# Patient Record
Sex: Female | Born: 1960 | Race: Black or African American | Hispanic: No | Marital: Single | Smoking: Never smoker
Health system: Southern US, Community
[De-identification: ages and names within clinical notes are randomized; demographics above are authoritative.]

## PROBLEM LIST (undated history)

## (undated) DIAGNOSIS — I10 Essential (primary) hypertension: Secondary | ICD-10-CM

## (undated) HISTORY — DX: Essential (primary) hypertension: I10

## (undated) HISTORY — PX: ABDOMINAL HYSTERECTOMY: SHX81

---

## 2009-05-15 ENCOUNTER — Ambulatory Visit: Payer: Self-pay | Admitting: Internal Medicine

## 2010-09-19 ENCOUNTER — Ambulatory Visit: Payer: Self-pay

## 2011-02-25 ENCOUNTER — Ambulatory Visit: Payer: Self-pay | Admitting: Gastroenterology

## 2011-02-27 LAB — PATHOLOGY REPORT

## 2011-06-05 ENCOUNTER — Ambulatory Visit: Payer: Self-pay

## 2011-09-20 ENCOUNTER — Ambulatory Visit: Payer: Self-pay

## 2012-09-21 ENCOUNTER — Ambulatory Visit: Payer: Self-pay

## 2013-12-07 ENCOUNTER — Ambulatory Visit: Payer: Self-pay

## 2015-12-08 ENCOUNTER — Other Ambulatory Visit: Payer: Self-pay | Admitting: Nurse Practitioner

## 2015-12-08 DIAGNOSIS — Z1231 Encounter for screening mammogram for malignant neoplasm of breast: Secondary | ICD-10-CM

## 2015-12-09 IMAGING — MG MM DIGITAL SCREENING BILAT W/ CAD
5 series · 5 of 5 positions shown · non-contrast
Comparison: Previous exam(s).

CLINICAL DATA: Screening.

EXAM:
DIGITAL SCREENING BILATERAL MAMMOGRAM WITH CAD

[L MLO]
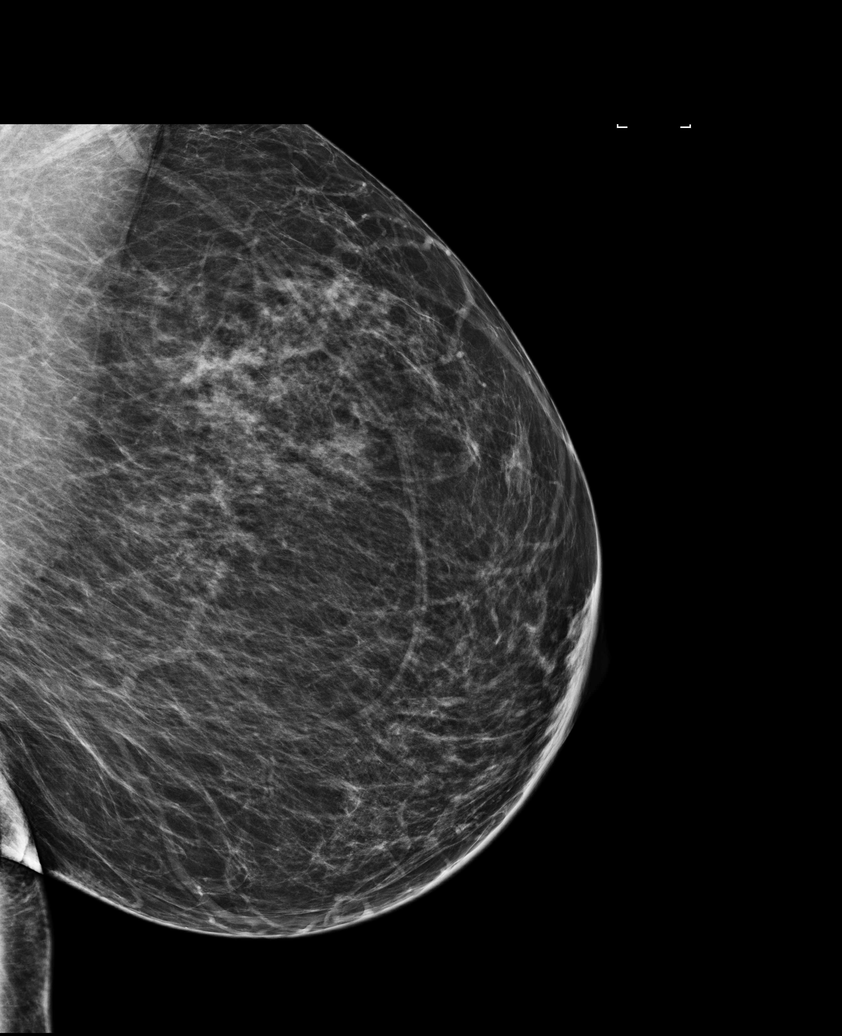

[R MLO (1 of 2)]
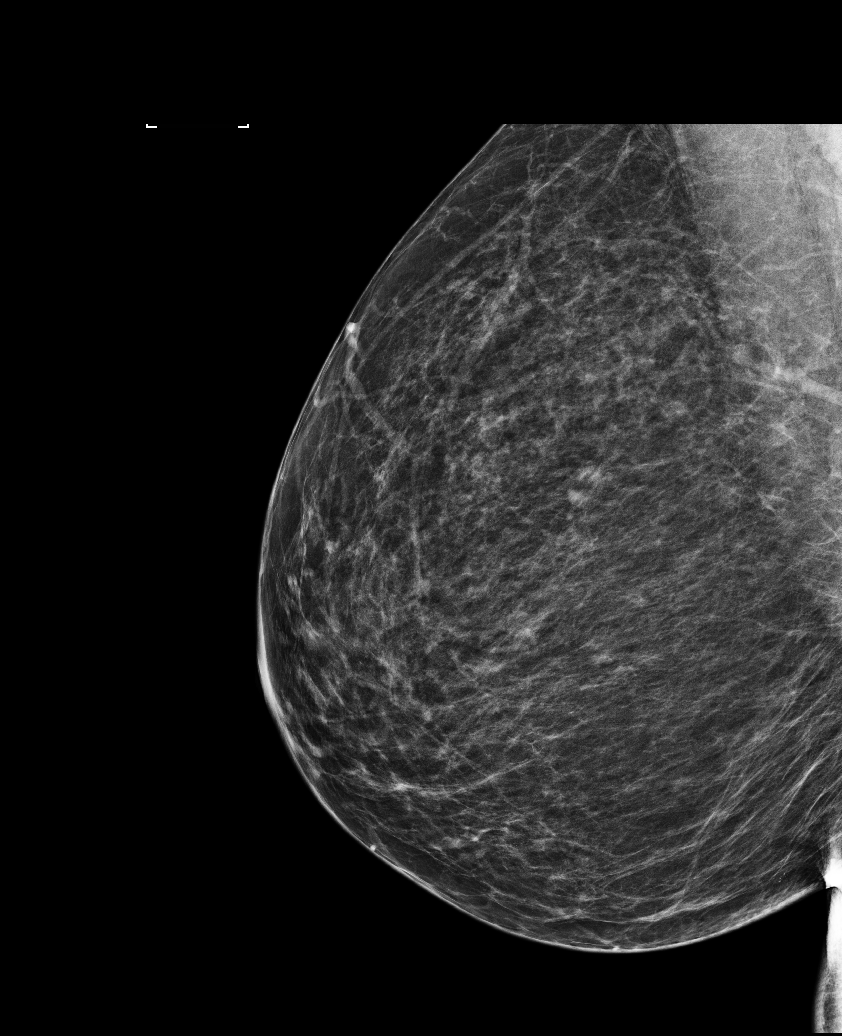

[L CC]
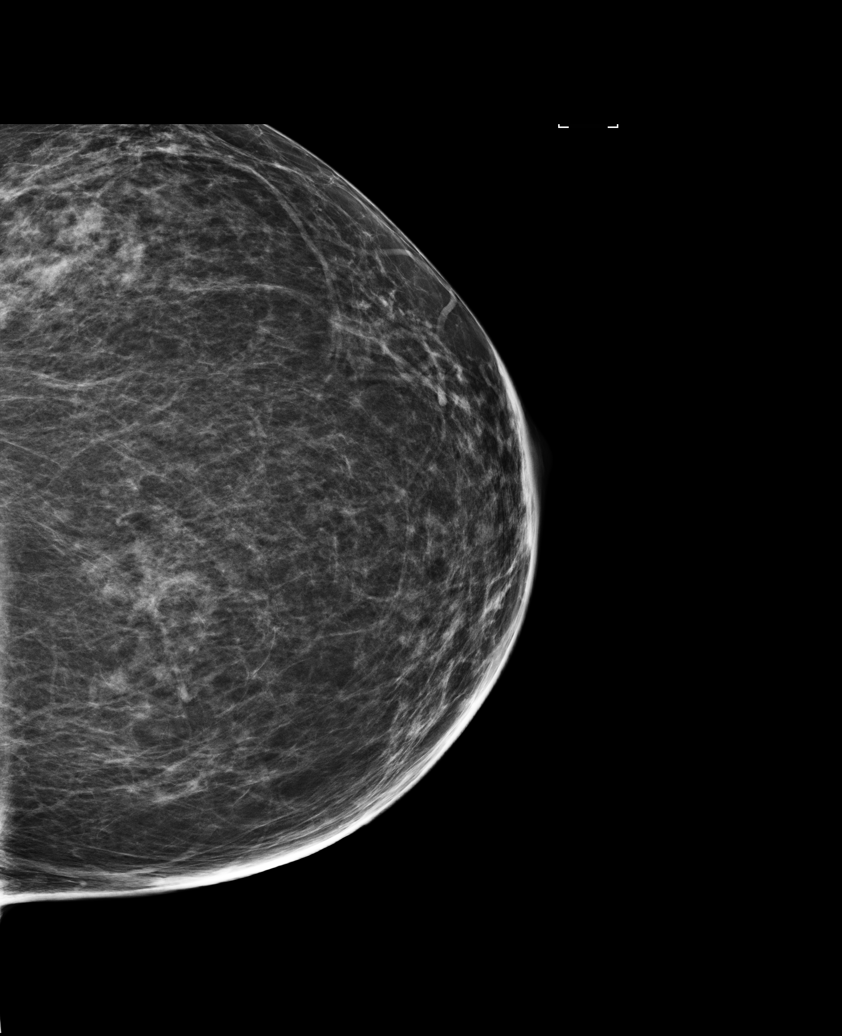

[R MLO (2 of 2)]
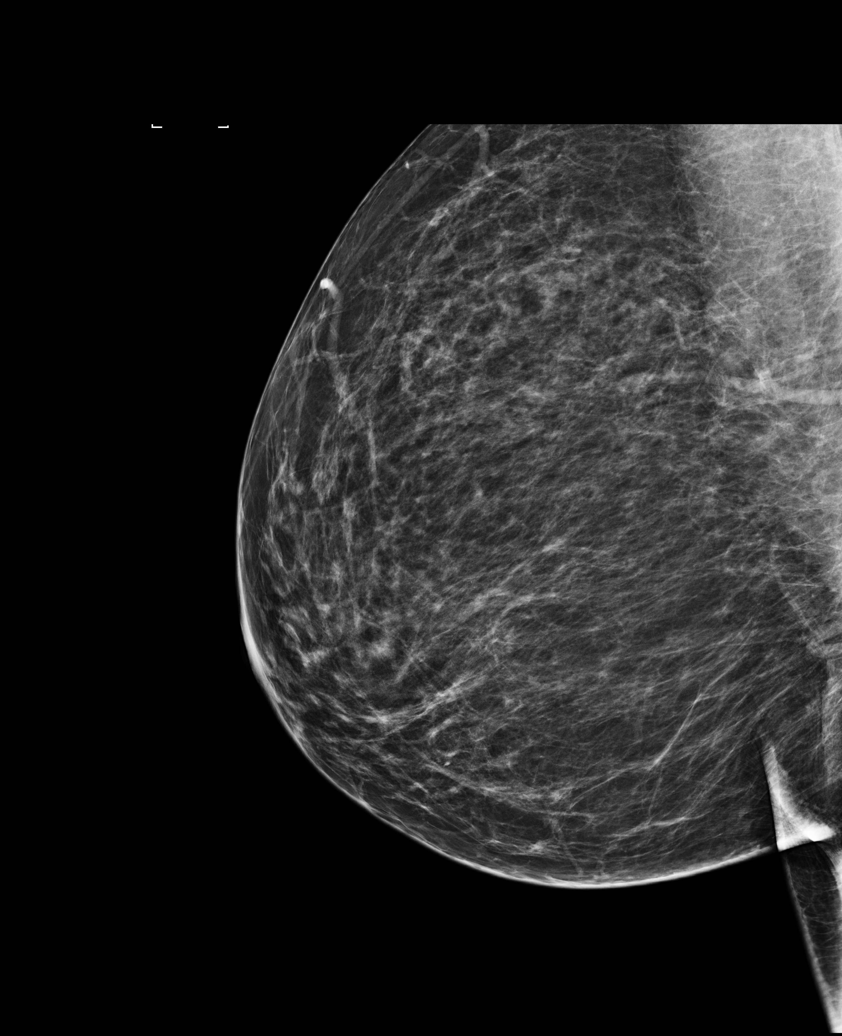

[R CC]
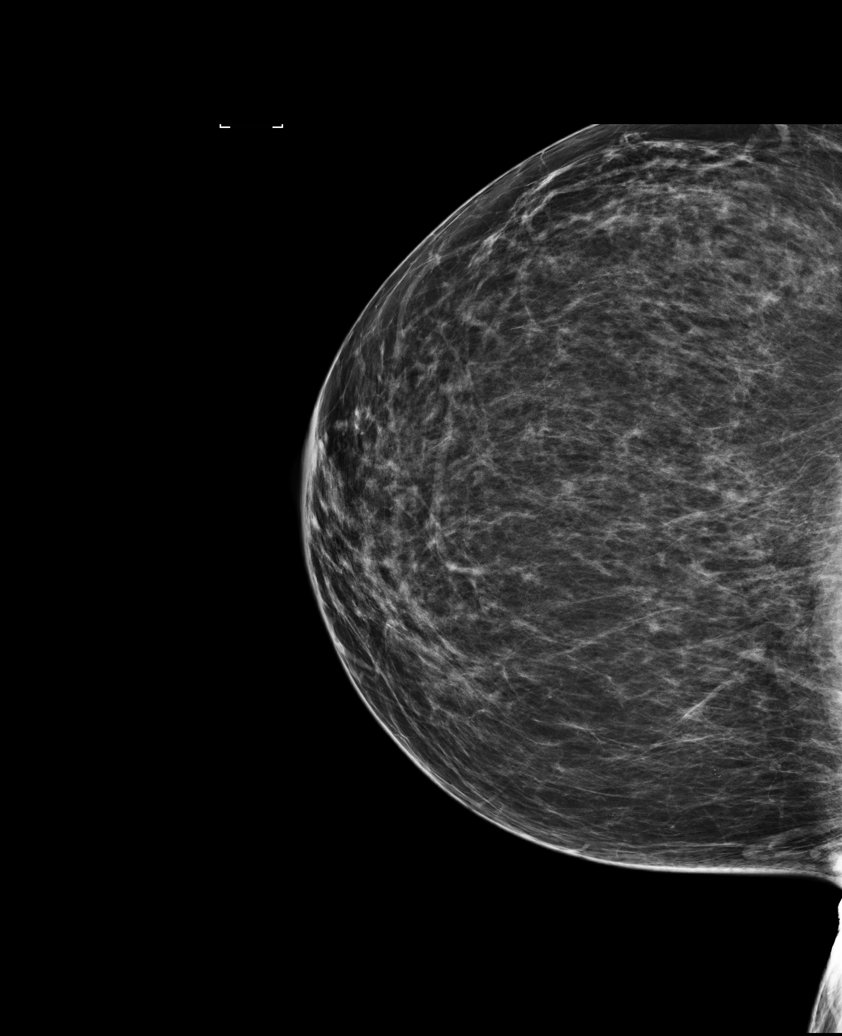

[5 of 5 positions shown; findings below may reference images not displayed]

ACR Breast Density Category b: There are scattered areas of
fibroglandular density.
FINDINGS: There are no findings suspicious for malignancy. Images were
processed with CAD.
IMPRESSION: No mammographic evidence of malignancy. A result letter of this
screening mammogram will be mailed directly to the patient.

RECOMMENDATION:
Screening mammogram in one year. (Code:AS-G-LCT)

BI-RADS CATEGORY  1: Negative.

## 2016-04-15 ENCOUNTER — Ambulatory Visit: Payer: Self-pay | Attending: Nurse Practitioner

## 2017-01-30 ENCOUNTER — Encounter: Payer: Self-pay | Admitting: Nurse Practitioner

## 2017-01-30 ENCOUNTER — Ambulatory Visit: Payer: BLUE CROSS/BLUE SHIELD | Admitting: Nurse Practitioner

## 2017-01-30 VITALS — BP 120/70 | HR 90 | Resp 16 | Ht 68.0 in | Wt 156.0 lb

## 2017-01-30 DIAGNOSIS — E559 Vitamin D deficiency, unspecified: Secondary | ICD-10-CM | POA: Diagnosis not present

## 2017-01-30 DIAGNOSIS — Z1231 Encounter for screening mammogram for malignant neoplasm of breast: Secondary | ICD-10-CM

## 2017-01-30 DIAGNOSIS — I10 Essential (primary) hypertension: Secondary | ICD-10-CM | POA: Insufficient documentation

## 2017-01-30 DIAGNOSIS — Z1239 Encounter for other screening for malignant neoplasm of breast: Secondary | ICD-10-CM

## 2017-01-30 NOTE — Progress Notes (Addendum)
[facility] [address] [place], [place] [address]  Internal MEDICINE  Office Visit Note  Patient Name: Donna Donaldson  [id number]  [id number]  Date of Service: 01/30/2017  Chief Complaint  Patient presents with  . Hypertension    Since her most recent visit, the patient has made some significant diet and lifestyle changes. She has stopped eating breads and salty foods. She has also cut back on soft drinks and sugary drinks. She has lost close to 10 pounds since she was last seen. She feels good .   Hypertension  This is a chronic problem. The current episode started more than 1 year ago. The problem has been gradually improving since onset. The problem is controlled. Pertinent negatives include no chest pain, headaches, neck pain, palpitations or shortness of breath. There are no associated agents to hypertension. There are no known risk factors for coronary artery disease. Past treatments include ACE inhibitors and diuretics. The current treatment provides significant improvement. There are no compliance problems.     Pt is here for routine follow up.    Current Medication: Outpatient Encounter Medications as of 01/30/2017  Medication Sig  . hydrochlorothiazide (HYDRODIURIL) 25 MG tablet Take by mouth.  Marland Kitchen lisinopril (PRINIVIL,ZESTRIL) 5 MG tablet Take 5 mg by mouth daily.   No facility-administered encounter medications on file as of 01/30/2017.     Surgical History: Past Surgical History:  Procedure Laterality Date  . ABDOMINAL HYSTERECTOMY      Medical History: Past Medical History:  Diagnosis Date  . Hypertension     Family History: Family History  Problem Relation Age of Onset  . Stroke Mother   . Hypertension Mother   . Diabetes Mother   . Diabetes Maternal Aunt   . Lung cancer Maternal Aunt     Social History   Socioeconomic History  . Marital status: Single    Spouse name: Not on file  . Number of children: Not on file  . Years of  education: Not on file  . Highest education level: Not on file  Social Needs  . Financial resource strain: Not on file  . Food insecurity - worry: Not on file  . Food insecurity - inability: Not on file  . Transportation needs - medical: Not on file  . Transportation needs - non-medical: Not on file  Occupational History  . Not on file  Tobacco Use  . Smoking status: Never Smoker  . Smokeless tobacco: Never Used  Substance and Sexual Activity  . Alcohol use: Yes    Frequency: Never    Comment: social  . Drug use: No  . Sexual activity: Not on file  Other Topics Concern  . Not on file  Social History Narrative  . Not on file      Review of Systems  Constitutional: Negative for activity change, appetite change, chills, fatigue and unexpected weight change.  HENT: Negative for congestion, postnasal drip, rhinorrhea, sneezing and sore throat.   Eyes: Negative.  Negative for redness.  Respiratory: Negative for cough, chest tightness and shortness of breath.   Cardiovascular: Negative for chest pain and palpitations.       Blood pressures doing very well.   Gastrointestinal: Negative for abdominal pain, constipation, diarrhea, nausea and vomiting.  Endocrine: Negative for cold intolerance and heat intolerance.  Genitourinary: Negative for dysuria and frequency.  Musculoskeletal: Negative for arthralgias, back pain, joint swelling and neck pain.  Skin: Negative for rash.  Neurological: Negative.  Negative for tremors, numbness  and headaches.  Hematological: Negative.  Negative for adenopathy. Does not bruise/bleed easily.  Psychiatric/Behavioral: Negative for behavioral problems (Depression), sleep disturbance and suicidal ideas. The patient is not nervous/anxious.     Today's Vitals   01/30/17 0940  BP: 120/70  Pulse: 90  Resp: 16  SpO2: 97%  Weight: 156 lb (70.8 kg)  Height: 5\' 8"  (1.727 m)    Physical Exam  Constitutional: She is oriented to person, place, and time.  She appears well-developed and well-nourished.  HENT:  Head: Normocephalic.  Eyes: Pupils are equal, round, and reactive to light.  Neck: Normal range of motion. Neck supple. No JVD present. Carotid bruit is not present. No thyromegaly present.  Cardiovascular: Normal rate, regular rhythm and normal heart sounds.  Pulmonary/Chest: Effort normal and breath sounds normal. She has no wheezes. She exhibits no tenderness.  Abdominal: Soft. Bowel sounds are normal. There is no tenderness.  Musculoskeletal: Normal range of motion.  Neurological: She is alert and oriented to person, place, and time.  Skin: Skin is warm and dry.  Psychiatric: She has a normal mood and affect. Her behavior is normal. Judgment and thought content normal.  Nursing note and vitals reviewed.   Assessment/Plan:  1. Essential hypertension bp doing very well. Continue lisinopril and HCTZ as prescribed. Will consider weaning off lisinopril of BP remains so well controlled.   2. Vitamin D deficiency Recent labs show normal vitamin d. Continue to monitor.   3. Screening for breast cancer - MM DIGITAL SCREENING BILATERAL; Future  General Counseling: Donna Donaldson verbalizes understanding of the findings of todays visit and agrees with plan of treatment. I have discussed any further diagnostic evaluation that may be needed or ordered today. We also reviewed her medications today. she has been encouraged to call the office with any questions or concerns that should arise related to todays visit.   This patient was seen by Vincent GrosHeather Evander Macaraeg, FNP- C in Collaboration with Dr Lyndon CodeFozia M Khan as a part of collaborative care agreement      Time spent: 15 Minutes      Dr Lyndon CodeFozia M Khan Internal medicine

## 2017-02-27 ENCOUNTER — Ambulatory Visit
Admission: RE | Admit: 2017-02-27 | Discharge: 2017-02-27 | Disposition: A | Discharge status: Home / Self Care | Payer: BLUE CROSS/BLUE SHIELD | Source: Ambulatory Visit | Attending: Nurse Practitioner | Admitting: Nurse Practitioner

## 2017-02-27 DIAGNOSIS — Z1231 Encounter for screening mammogram for malignant neoplasm of breast: Secondary | ICD-10-CM | POA: Diagnosis not present

## 2017-02-27 DIAGNOSIS — Z1239 Encounter for other screening for malignant neoplasm of breast: Secondary | ICD-10-CM

## 2017-03-01 ENCOUNTER — Other Ambulatory Visit: Payer: Self-pay | Admitting: Internal Medicine

## 2017-03-03 ENCOUNTER — Other Ambulatory Visit: Payer: Self-pay

## 2017-07-07 ENCOUNTER — Telehealth: Payer: Self-pay

## 2017-07-07 NOTE — Telephone Encounter (Signed)
Pt called that her Bp is high 159/100 and she took her med this morning and she check again 162/108 no chest pain or headache but her left leg feels funny may be tingling she not specify but her bp is not  Going down as per heather advised pt  to  ER and keep follow up with us

## 2017-07-31 ENCOUNTER — Ambulatory Visit: Payer: BLUE CROSS/BLUE SHIELD | Admitting: Nurse Practitioner

## 2017-07-31 ENCOUNTER — Encounter: Payer: Self-pay | Admitting: Nurse Practitioner

## 2017-07-31 VITALS — BP 132/90 | HR 74 | Resp 16 | Ht 68.0 in | Wt 160.2 lb

## 2017-07-31 DIAGNOSIS — Z0001 Encounter for general adult medical examination with abnormal findings: Secondary | ICD-10-CM | POA: Diagnosis not present

## 2017-07-31 DIAGNOSIS — R300 Dysuria: Secondary | ICD-10-CM

## 2017-07-31 DIAGNOSIS — I10 Essential (primary) hypertension: Secondary | ICD-10-CM | POA: Diagnosis not present

## 2017-07-31 NOTE — Progress Notes (Signed)
[facility] [address] [place], [place] [address]  Internal MEDICINE  Office Visit Note  Patient Name: Donna Donaldson  [id number]  [id number]  Date of Service: 08/17/2017   Pt is here for routine health maintenance examination   Chief Complaint  Patient presents with  . Annual Exam  . Hypertension    6 month follow up     Hypertension  This is a chronic problem. The current episode started more than 1 year ago. The problem is unchanged. The problem is controlled. Pertinent negatives include no chest pain, headaches, neck pain, palpitations or shortness of breath. There are no associated agents to hypertension. Risk factors for coronary artery disease include family history and post-menopausal state. Past treatments include ACE inhibitors and diuretics. The current treatment provides moderate improvement. There are no compliance problems.     Current Medication: Outpatient Encounter Medications as of 07/31/2017  Medication Sig Note  . hydrochlorothiazide (HYDRODIURIL) 25 MG tablet Take by mouth.   Marland Kitchen lisinopril (PRINIVIL,ZESTRIL) 5 MG tablet take 1 tablet by mouth once daily 07/31/2017: Pt is taking 25mg  dose   No facility-administered encounter medications on file as of 07/31/2017.     Surgical History: Past Surgical History:  Procedure Laterality Date  . ABDOMINAL HYSTERECTOMY      Medical History: Past Medical History:  Diagnosis Date  . Hypertension     Family History: Family History  Problem Relation Age of Onset  . Stroke Mother   . Hypertension Mother   . Diabetes Mother   . Diabetes Maternal Aunt   . Lung cancer Maternal Aunt   . Breast cancer Neg Hx       Review of Systems  Constitutional: Negative for activity change, appetite change, chills, fatigue and unexpected weight change.  HENT: Negative for congestion, postnasal drip, rhinorrhea, sneezing, sore throat and voice change.   Eyes: Negative.  Negative for redness.  Respiratory:  Negative for cough, chest tightness and shortness of breath.   Cardiovascular: Negative for chest pain and palpitations.       Blood pressures doing very well.   Gastrointestinal: Negative for abdominal pain, constipation, diarrhea, nausea and vomiting.  Endocrine: Negative for cold intolerance, heat intolerance, polydipsia, polyphagia and polyuria.  Genitourinary: Negative.  Negative for dysuria and frequency.  Musculoskeletal: Negative for arthralgias, back pain, joint swelling and neck pain.  Skin: Negative for rash.  Allergic/Immunologic: Negative for environmental allergies and food allergies.  Neurological: Negative for dizziness, tremors, weakness, light-headedness, numbness and headaches.  Hematological: Negative for adenopathy. Does not bruise/bleed easily.  Psychiatric/Behavioral: Negative for behavioral problems (Depression), sleep disturbance and suicidal ideas. The patient is not nervous/anxious.      Today's Vitals   07/31/17 1003  BP: 132/90  Pulse: 74  Resp: 16  SpO2: 98%  Weight: 160 lb 3.2 oz (72.7 kg)  Height: 5\' 8"  (1.727 m)    Physical Exam  Constitutional: She is oriented to person, place, and time. She appears well-developed and well-nourished.  HENT:  Head: Normocephalic.  Nose: Nose normal.  Mouth/Throat: Oropharynx is clear and moist.  Eyes: Pupils are equal, round, and reactive to light. Conjunctivae and EOM are normal.  Neck: Normal range of motion. Neck supple. No JVD present. Carotid bruit is not present. No tracheal deviation present. No thyromegaly present.  Cardiovascular: Normal rate, regular rhythm, normal heart sounds and intact distal pulses.  Pulmonary/Chest: Effort normal and breath sounds normal. She has no wheezes. She exhibits no tenderness. Right breast exhibits no inverted nipple,  no mass, no nipple discharge, no skin change and no tenderness. Left breast exhibits no inverted nipple, no mass, no nipple discharge, no skin change and no  tenderness.  Abdominal: Soft. Bowel sounds are normal. There is no tenderness.  Musculoskeletal: Normal range of motion.  Lymphadenopathy:    She has no cervical adenopathy.  Neurological: She is alert and oriented to person, place, and time.  Skin: Skin is warm and dry. Capillary refill takes less than 2 seconds.  Psychiatric: She has a normal mood and affect. Her behavior is normal. Judgment and thought content normal.  Nursing note and vitals reviewed.    LABS: Recent Results (from the past 2160 hour(s))  UA/M w/rflx Culture, Routine     Status: None   Collection Time: 07/31/17 12:00 AM  Result Value Ref Range   Specific Gravity, UA 1.017 1.005 - 1.030   pH, UA 6.5 5.0 - 7.5   Color, UA Yellow Yellow   Appearance Ur Clear Clear   Leukocytes, UA Negative Negative   Protein, UA Negative Negative/Trace   Glucose, UA Negative Negative   Ketones, UA Negative Negative   RBC, UA Negative Negative   Bilirubin, UA Negative Negative   Urobilinogen, Ur 0.2 0.2 - 1.0 mg/dL   Nitrite, UA Negative Negative   Microscopic Examination Comment     Comment: Microscopic follows if indicated.   Microscopic Examination See below:     Comment: Microscopic was indicated and was performed.   Urinalysis Reflex Comment     Comment: This specimen will not reflex to a Urine Culture.  Microscopic Examination     Status: None   Collection Time: 07/31/17 12:00 AM  Result Value Ref Range   WBC, UA 0-5 0 - 5 /hpf   RBC, UA 0-2 0 - 2 /hpf   Epithelial Cells (non renal) None seen 0 - 10 /hpf   Casts None seen None seen /lpf   Mucus, UA Present Not Estab.   Bacteria, UA None seen None seen/Few    Assessment/Plan: 1. Encounter for general adult medical examination with abnormal findings Annual health maintenance exam today  2. Essential hypertension Stable. Continue bp medication as prescribed   3. Dysuria - UA/M w/rflx Culture, Routine  General Counseling: Donna Donaldson verbalizes understanding of the  findings of todays visit and agrees with plan of treatment. I have discussed any further diagnostic evaluation that may be needed or ordered today. We also reviewed her medications today. she has been encouraged to call the office with any questions or concerns that should arise related to todays visit.    Counseling:  Hypertension Counseling:   The following hypertensive lifestyle modification were recommended and discussed:  1. Limiting alcohol intake to less than 1 oz/day of ethanol:(24 oz of beer or 8 oz of wine or 2 oz of 100-proof whiskey). 2. Take baby ASA 81 mg daily. 3. Importance of regular aerobic exercise and losing weight. 4. Reduce dietary saturated fat and cholesterol intake for overall cardiovascular health. 5. Maintaining adequate dietary potassium, calcium, and magnesium intake. 6. Regular monitoring of the blood pressure. 7. Reduce sodium intake to less than 100 mmol/day (less than 2.3 gm of sodium or less than 6 gm of sodium choride)   This patient was seen by Vincent GrosHeather Rekita Miotke FNP Collaboration with Dr Lyndon CodeFozia M Khan as a part of collaborative care agreement  Orders Placed This Encounter  Procedures  . Microscopic Examination  . UA/M w/rflx Culture, Routine     Time spent: 30 Minutes  Lavera Guise, MD  Internal Medicine

## 2017-08-01 LAB — UA/M W/RFLX CULTURE, ROUTINE
BILIRUBIN UA: NEGATIVE
Glucose, UA: NEGATIVE
KETONES UA: NEGATIVE
LEUKOCYTES UA: NEGATIVE
Nitrite, UA: NEGATIVE
Protein, UA: NEGATIVE
RBC, UA: NEGATIVE
Specific Gravity, UA: 1.017 (ref 1.005–1.030)
Urobilinogen, Ur: 0.2 mg/dL (ref 0.2–1.0)
pH, UA: 6.5 (ref 5.0–7.5)

## 2017-08-01 LAB — MICROSCOPIC EXAMINATION
BACTERIA UA: NONE SEEN
CASTS: NONE SEEN /LPF
EPITHELIAL CELLS (NON RENAL): NONE SEEN /HPF (ref 0–10)

## 2017-08-17 DIAGNOSIS — R300 Dysuria: Secondary | ICD-10-CM | POA: Insufficient documentation

## 2017-08-17 DIAGNOSIS — Z0001 Encounter for general adult medical examination with abnormal findings: Secondary | ICD-10-CM | POA: Insufficient documentation

## 2017-11-21 ENCOUNTER — Other Ambulatory Visit: Payer: Self-pay | Admitting: Nurse Practitioner

## 2017-11-22 LAB — COMPREHENSIVE METABOLIC PANEL
A/G RATIO: 1.8 (ref 1.2–2.2)
ALBUMIN: 4.4 g/dL (ref 3.5–5.5)
ALT: 23 IU/L (ref 0–32)
AST: 21 IU/L (ref 0–40)
Alkaline Phosphatase: 62 IU/L (ref 39–117)
BILIRUBIN TOTAL: 0.4 mg/dL (ref 0.0–1.2)
BUN / CREAT RATIO: 15 (ref 9–23)
BUN: 13 mg/dL (ref 6–24)
CHLORIDE: 102 mmol/L (ref 96–106)
CO2: 26 mmol/L (ref 20–29)
Calcium: 10 mg/dL (ref 8.7–10.2)
Creatinine, Ser: 0.84 mg/dL (ref 0.57–1.00)
GFR calc Af Amer: 88 mL/min/{1.73_m2} (ref >59)
GFR calc non Af Amer: 76 mL/min/{1.73_m2} (ref >59)
Globulin, Total: 2.5 g/dL (ref 1.5–4.5)
Glucose: 95 mg/dL (ref 65–99)
POTASSIUM: 4.7 mmol/L (ref 3.5–5.2)
Sodium: 141 mmol/L (ref 134–144)
Total Protein: 6.9 g/dL (ref 6.0–8.5)

## 2017-11-22 LAB — VITAMIN D 25 HYDROXY (VIT D DEFICIENCY, FRACTURES): Vit D, 25-Hydroxy: 52.5 ng/mL (ref 30.0–100.0)

## 2017-11-22 LAB — CBC
Hematocrit: 39 % (ref 34.0–46.6)
Hemoglobin: 12.9 g/dL (ref 11.1–15.9)
MCH: 30.6 pg (ref 26.6–33.0)
MCHC: 33.1 g/dL (ref 31.5–35.7)
MCV: 92 fL (ref 79–97)
PLATELETS: 234 10*3/uL (ref 150–450)
RBC: 4.22 x10E6/uL (ref 3.77–5.28)
RDW: 11.9 % — AB (ref 12.3–15.4)
WBC: 2.8 10*3/uL — ABNORMAL LOW (ref 3.4–10.8)

## 2017-11-22 LAB — LIPID PANEL W/O CHOL/HDL RATIO
CHOLESTEROL TOTAL: 182 mg/dL (ref 100–199)
HDL: 66 mg/dL (ref >39)
LDL Calculated: 106 mg/dL — ABNORMAL HIGH (ref 0–99)
TRIGLYCERIDES: 50 mg/dL (ref 0–149)
VLDL Cholesterol Cal: 10 mg/dL (ref 5–40)

## 2017-11-22 LAB — TSH: TSH: 2.57 u[IU]/mL (ref 0.450–4.500)

## 2017-11-22 LAB — T4, FREE: FREE T4: 1.04 ng/dL (ref 0.82–1.77)

## 2018-01-29 ENCOUNTER — Other Ambulatory Visit: Payer: Self-pay | Admitting: Nurse Practitioner

## 2018-01-29 DIAGNOSIS — Z1231 Encounter for screening mammogram for malignant neoplasm of breast: Secondary | ICD-10-CM

## 2018-02-02 ENCOUNTER — Ambulatory Visit: Payer: BLUE CROSS/BLUE SHIELD | Admitting: Adult Health

## 2018-02-13 ENCOUNTER — Encounter: Payer: Self-pay | Admitting: Adult Health

## 2018-02-13 ENCOUNTER — Ambulatory Visit: Payer: BLUE CROSS/BLUE SHIELD | Admitting: Adult Health

## 2018-02-13 ENCOUNTER — Ambulatory Visit: Admit: 2018-02-13 | Payer: BLUE CROSS/BLUE SHIELD

## 2018-02-13 VITALS — BP 130/90 | HR 66 | Resp 16 | Ht 68.0 in | Wt 163.0 lb

## 2018-02-13 DIAGNOSIS — M79672 Pain in left foot: Secondary | ICD-10-CM | POA: Diagnosis not present

## 2018-02-13 DIAGNOSIS — M79605 Pain in left leg: Secondary | ICD-10-CM | POA: Diagnosis not present

## 2018-02-13 DIAGNOSIS — I10 Essential (primary) hypertension: Secondary | ICD-10-CM

## 2018-02-13 MED ORDER — LISINOPRIL 20 MG PO TABS: 20.0000 mg | ORAL_TABLET | Freq: Every day | ORAL | 11 refills | Status: DC

## 2018-02-13 MED ORDER — HYDROCHLOROTHIAZIDE 25 MG PO TABS: 25.0000 mg | ORAL_TABLET | Freq: Every day | ORAL | 11 refills | Status: DC

## 2018-02-13 MED ORDER — MELOXICAM 15 MG PO TABS: 15.0000 mg | ORAL_TABLET | Freq: Every day | ORAL | 0 refills | Status: DC

## 2018-02-13 NOTE — Patient Instructions (Signed)

## 2018-02-13 NOTE — Progress Notes (Signed)
[facility] [address] [place], [place] [address]  Internal MEDICINE  Office Visit Note  Patient Name: Donna PerchesDiane Donaldson  [id number]  [id number]  Date of Service: 04/08/2018  Chief Complaint  Patient presents with  . Foot Pain    left   . Leg Pain    left  . Hypertension    med refills      HPI Pt is here for a sick visit. Pt reports left foot and leg pain x 1 month.  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  She works 5 days a week. She Has been wearing knee-high support stockings without much relief.    Current Medication:  Outpatient Encounter Medications as of 02/13/2018  Medication Sig Note  . lisinopril (PRINIVIL,ZESTRIL) 20 MG tablet Take 1 tablet (20 mg total) by mouth daily.   . [DISCONTINUED] hydrochlorothiazide (HYDRODIURIL) 25 MG tablet Take by mouth.   . [DISCONTINUED] hydrochlorothiazide (HYDRODIURIL) 25 MG tablet Take 1 tablet (25 mg total) by mouth daily.   . [DISCONTINUED] lisinopril (PRINIVIL,ZESTRIL) 20 MG tablet Take by mouth daily.    . meloxicam (MOBIC) 15 MG tablet Take 1 tablet (15 mg total) by mouth daily.   . [DISCONTINUED] lisinopril (PRINIVIL,ZESTRIL) 5 MG tablet take 1 tablet by mouth once daily 07/31/2017: Pt is taking 25mg  dose   No facility-administered encounter medications on file as of 02/13/2018.       Medical History: Past Medical History:  Diagnosis Date  . Hypertension      Vital Signs: BP 130/90   Pulse 66   Resp 16   Ht 5\' 8"  (1.727 m)   Wt 163 lb (73.9 kg)   SpO2 99%   BMI 24.78 kg/m    Review of Systems  Constitutional: Negative for chills, fatigue and unexpected weight change.  HENT: Negative for congestion, rhinorrhea, sneezing and sore throat.   Eyes: Negative for photophobia, pain and redness.  Respiratory: Negative for cough, chest tightness and shortness of breath.   Cardiovascular: Negative for chest pain and palpitations.  Gastrointestinal: Negative  for abdominal pain, constipation, diarrhea, nausea and vomiting.  Endocrine: Negative.   Genitourinary: Negative for dysuria and frequency.  Musculoskeletal: Negative for arthralgias, back pain, joint swelling and neck pain.  Skin: Negative for rash.  Allergic/Immunologic: Negative.   Neurological: Negative for tremors and numbness.  Hematological: Negative for adenopathy. Does not bruise/bleed easily.  Psychiatric/Behavioral: Negative for behavioral problems and sleep disturbance. The patient is not nervous/anxious.     Physical Exam Vitals signs and nursing note reviewed.  Constitutional:      General: She is not in acute distress.    Appearance: She is well-developed. She is not diaphoretic.  HENT:     Head: Normocephalic and atraumatic.     Mouth/Throat:     Pharynx: No oropharyngeal exudate.  Eyes:     Pupils: Pupils are equal, round, and reactive to light.  Neck:     Musculoskeletal: Normal range of motion and neck supple.     Thyroid: No thyromegaly.     Vascular: No JVD.     Trachea: No tracheal deviation.  Cardiovascular:     Rate and Rhythm: Normal rate and regular rhythm.     Heart sounds: Normal heart sounds. No murmur. No friction rub. No gallop.   Pulmonary:     Effort: Pulmonary effort is normal. No respiratory distress.     Breath sounds: Normal breath sounds. No wheezing or rales.  Chest:     Chest wall: No tenderness.  Abdominal:     Palpations: Abdomen is soft.     Tenderness: There is no abdominal tenderness. There is no guarding.  Musculoskeletal: Normal range of motion.  Lymphadenopathy:     Cervical: No cervical adenopathy.  Skin:    General: Skin is warm and dry.  Neurological:     Mental Status: She is alert and oriented to person, place, and time.     Cranial Nerves: No cranial nerve deficit.  Psychiatric:        Behavior: Behavior normal.        Thought Content: Thought content normal.        Judgment: Judgment normal.     Assessment/Plan: 1. Left leg pain Doppler/Ultrasound ordered of patients leg due to ongoing pain, swelling.  Will review results when available.  Pt given RX for Mobic for pain.  - US ARTERIAL ABI (SCREENING LOWER EXTREMITY); Future - [place] Lower Ext Art Bilat; Future - meloxicam (MOBIC) 15 MG tablet; Take 1 tablet (15 mg total) by mouth daily.  Dispense: 30 tablet; Refill: 0  2. Left foot pain X ray of left foot ordered due to tenderness and ongoing pain/swelling.  - meloxicam (MOBIC) 15 MG tablet; Take 1 tablet (15 mg total) by mouth daily.  Dispense: 30 tablet; Refill: 0 - DG Foot Complete Left; Future  3. Essential hypertension Stable, continue present therapy.  General Counseling: Sharnetta verbalizes understanding of the findings of todays visit and agrees with plan of treatment. I have discussed any further diagnostic evaluation that may be needed or ordered today. We also reviewed her medications today. she has been encouraged to call the office with any questions or concerns that should arise related to todays visit.   Orders Placed This Encounter  Procedures  . US ARTERIAL ABI (SCREENING LOWER EXTREMITY)  . [place] Lower Ext Art Bilat  . DG Foot Complete Left    Meds ordered this encounter  Medications  . DISCONTD: hydrochlorothiazide (HYDRODIURIL) 25 MG tablet    Sig: Take 1 tablet (25 mg total) by mouth daily.    Dispense:  30 tablet    Refill:  11  . lisinopril (PRINIVIL,ZESTRIL) 20 MG tablet    Sig: Take 1 tablet (20 mg total) by mouth daily.    Dispense:  30 tablet    Refill:  11  . meloxicam (MOBIC) 15 MG tablet    Sig: Take 1 tablet (15 mg total) by mouth daily.    Dispense:  30 tablet    Refill:  0    Time spent: 25 Minutes  This patient was seen by Blima Ledger AGNP-C in Collaboration with Dr Lyndon Code as a part of collaborative care agreement.  Johnna Acosta AGNP-C Internal Medicine

## 2018-02-19 ENCOUNTER — Telehealth: Payer: Self-pay

## 2018-02-19 NOTE — Telephone Encounter (Signed)
Patient advised xray of foot is normal. [place]

## 2018-02-27 ENCOUNTER — Other Ambulatory Visit: Payer: Self-pay

## 2018-03-06 ENCOUNTER — Ambulatory Visit: Payer: BLUE CROSS/BLUE SHIELD

## 2018-03-06 ENCOUNTER — Ambulatory Visit (INDEPENDENT_AMBULATORY_CARE_PROVIDER_SITE_OTHER): Payer: BLUE CROSS/BLUE SHIELD

## 2018-03-06 DIAGNOSIS — M79605 Pain in left leg: Secondary | ICD-10-CM | POA: Diagnosis not present

## 2018-03-09 ENCOUNTER — Ambulatory Visit: Payer: BLUE CROSS/BLUE SHIELD | Admitting: Adult Health

## 2018-03-09 ENCOUNTER — Encounter: Payer: Self-pay | Admitting: Adult Health

## 2018-03-09 VITALS — BP 116/88 | HR 71 | Resp 16 | Ht 68.0 in | Wt 162.0 lb

## 2018-03-09 DIAGNOSIS — I83812 Varicose veins of left lower extremities with pain: Secondary | ICD-10-CM

## 2018-03-09 DIAGNOSIS — M79605 Pain in left leg: Secondary | ICD-10-CM

## 2018-03-09 DIAGNOSIS — I10 Essential (primary) hypertension: Secondary | ICD-10-CM

## 2018-03-09 NOTE — Progress Notes (Signed)
[facility] [address] [place], [place] [address]  Internal MEDICINE  Office Visit Note  Patient Name: Donna Donaldson  [id number]  [id number]  Date of Service: 03/09/2018  Chief Complaint  Patient presents with  . Leg Pain     3 week follow up US results , little twinge behind the left thigh    HPI Pt is here for follow up on BLE doppler/ultrasounds.  The US reveals no significant issues.  No stenosis or blood clots were found.  Pt repots her pain is still present however, she has more days between that are pain free.         Current Medication: Outpatient Encounter Medications as of 03/09/2018  Medication Sig  . hydrochlorothiazide (HYDRODIURIL) 25 MG tablet Take 1 tablet (25 mg total) by mouth daily.  Marland Kitchen lisinopril (PRINIVIL,ZESTRIL) 20 MG tablet Take 1 tablet (20 mg total) by mouth daily.  . meloxicam (MOBIC) 15 MG tablet Take 1 tablet (15 mg total) by mouth daily.   No facility-administered encounter medications on file as of 03/09/2018.     Surgical History: Past Surgical History:  Procedure Laterality Date  . ABDOMINAL HYSTERECTOMY      Medical History: Past Medical History:  Diagnosis Date  . Hypertension     Family History: Family History  Problem Relation Age of Onset  . Stroke Mother   . Hypertension Mother   . Diabetes Mother   . Diabetes Maternal Aunt   . Lung cancer Maternal Aunt   . Breast cancer Neg Hx     Social History   Socioeconomic History  . Marital status: Single    Spouse name: Not on file  . Number of children: Not on file  . Years of education: Not on file  . Highest education level: Not on file  Occupational History  . Not on file  Social Needs  . Financial resource strain: Not on file  . Food insecurity:    Worry: Not on file    Inability: Not on file  . Transportation needs:    Medical: Not on file    Non-medical: Not on file  Tobacco Use  . Smoking status: Never Smoker  . Smokeless tobacco: Never Used   Substance and Sexual Activity  . Alcohol use: Yes    Frequency: Never    Comment: social  . Drug use: No  . Sexual activity: Not on file  Lifestyle  . Physical activity:    Days per week: Not on file    Minutes per session: Not on file  . Stress: Not on file  Relationships  . Social connections:    Talks on phone: Not on file    Gets together: Not on file    Attends religious service: Not on file    Active member of club or organization: Not on file    Attends meetings of clubs or organizations: Not on file    Relationship status: Not on file  . Intimate partner violence:    Fear of current or ex partner: Not on file    Emotionally abused: Not on file    Physically abused: Not on file    Forced sexual activity: Not on file  Other Topics Concern  . Not on file  Social History Narrative  . Not on file      Review of Systems  Constitutional: Negative for chills, fatigue and unexpected weight change.  HENT: Negative for congestion, rhinorrhea, sneezing and sore throat.   Eyes: Negative  for photophobia, pain and redness.  Respiratory: Negative for cough, chest tightness and shortness of breath.   Cardiovascular: Negative for chest pain and palpitations.  Gastrointestinal: Negative for abdominal pain, constipation, diarrhea, nausea and vomiting.  Endocrine: Negative.   Genitourinary: Negative for dysuria and frequency.  Musculoskeletal: Negative for arthralgias, back pain, joint swelling and neck pain.  Skin: Negative for rash.  Allergic/Immunologic: Negative.   Neurological: Negative for tremors and numbness.  Hematological: Negative for adenopathy. Does not bruise/bleed easily.  Psychiatric/Behavioral: Negative for behavioral problems and sleep disturbance. The patient is not nervous/anxious.     Vital Signs: BP 116/88   Pulse 71   Resp 16   Ht 5\' 8"  (1.727 m)   Wt 162 lb (73.5 kg)   SpO2 96%   BMI 24.63 kg/m    Physical Exam Vitals signs and nursing note  reviewed.  Constitutional:      General: She is not in acute distress.    Appearance: She is well-developed. She is not diaphoretic.  HENT:     Head: Normocephalic and atraumatic.     Mouth/Throat:     Pharynx: No oropharyngeal exudate.  Eyes:     Pupils: Pupils are equal, round, and reactive to light.  Neck:     Musculoskeletal: Normal range of motion and neck supple.     Thyroid: No thyromegaly.     Vascular: No JVD.     Trachea: No tracheal deviation.  Cardiovascular:     Rate and Rhythm: Normal rate and regular rhythm.     Heart sounds: Normal heart sounds. No murmur. No friction rub. No gallop.   Pulmonary:     Effort: Pulmonary effort is normal. No respiratory distress.     Breath sounds: Normal breath sounds. No wheezing or rales.  Chest:     Chest wall: No tenderness.  Abdominal:     Palpations: Abdomen is soft.     Tenderness: There is no abdominal tenderness. There is no guarding.  Musculoskeletal: Normal range of motion.  Lymphadenopathy:     Cervical: No cervical adenopathy.  Skin:    General: Skin is warm and dry.  Neurological:     Mental Status: She is alert and oriented to person, place, and time.     Cranial Nerves: No cranial nerve deficit.  Psychiatric:        Behavior: Behavior normal.        Thought Content: Thought content normal.        Judgment: Judgment normal.    Assessment/Plan: 1. Left leg pain Patient continues to report some intermittent leg pain mostly posteriorly.  However it appears to slowly begin better.  2. Varicose veins of left lower extremity with pain Discussed varicose veins with patient.  We will document that she has pain with these.  Offered her a referral to vascular surgery which she has declined at this time.  If she changes her mind she will let us know and we will make the referral.  3. Essential hypertension Stable, continue current medications as prescribed  General Counseling: Erleen verbalizes understanding of the  findings of todays visit and agrees with plan of treatment. I have discussed any further diagnostic evaluation that may be needed or ordered today. We also reviewed her medications today. she has been encouraged to call the office with any questions or concerns that should arise related to todays visit.    No orders of the defined types were placed in this encounter.   No orders of  the defined types were placed in this encounter.   Time spent: 20  Minutes   This patient was seen by Blima Ledger AGNP-C in Collaboration with Dr Lyndon Code as a part of collaborative care agreement     Johnna Acosta AGNP-C Internal medicine

## 2018-03-09 NOTE — Patient Instructions (Signed)
Varicose Veins Varicose veins are veins that have become enlarged, bulged, and twisted. They most often appear in the legs. What are the causes? This condition is caused by damage to the valves in the vein. These valves help blood return to your heart. When they are damaged and they stop working properly, blood may flow backward and back up in the veins near the skin, causing the veins to get larger and appear twisted. The condition can result from any issue that causes blood to back up, like pregnancy, prolonged standing, or obesity. What increases the risk? This condition is more likely to develop in people who are:  On their feet a lot.  Pregnant.  Overweight. What are the signs or symptoms? Symptoms of this condition include:  Bulging, twisted, and bluish veins.  A feeling of heaviness. This may be worse at the end of the day.  Leg pain. This may be worse at the end of the day.  Swelling in the leg.  Changes in skin color over the veins. How is this diagnosed? This condition may be diagnosed based on your symptoms, a physical exam, and an ultrasound test. How is this treated? Treatment for this condition may involve:  Avoiding sitting or standing in one position for long periods of time.  Wearing compression stockings. These stockings help to prevent blood clots and reduce swelling in the legs.  Raising (elevating) the legs when resting.  Losing weight.  Exercising regularly. If you have persistent symptoms or want to improve the way your varicose veins look, you may choose to have a procedure to close the varicose veins off or to remove them. Treatments to close off the veins include:  Sclerotherapy. In this treatment, a solution is injected into a vein to close it off.  Laser treatment. In this treatment, the vein is heated with a laser to close it off.  Radiofrequency vein ablation. In this treatment, an electrical current produced by radio waves is used to close  off the vein. Treatments to remove the veins include:  Phlebectomy. In this treatment, the veins are removed through small incisions made over the veins.  Vein ligation and stripping. In this treatment, incisions are made over the veins. The veins are then removed after being tied (ligated) with stitches (sutures). Follow these instructions at home: Activity  Walk as much as possible. Walking increases blood flow. This helps blood return to the heart and takes pressure off your veins. It also increases your cardiovascular strength.  Follow your health care provider's instructions about exercising.  Do not stand or sit in one position for a long period of time.  Do not sit with your legs crossed.  Rest with your legs raised during the day. General instructions   Follow any diet instructions given to you by your health care provider.  Wear compression stockings as directed by your health care provider. Do not wear other kinds of tight clothing around your legs, pelvis, or waist.  Elevate your legs at night to above the level of your heart.  If you get a cut in the skin over the varicose vein and the vein bleeds: ? Lie down with your leg raised. ? Apply firm pressure to the cut with a clean cloth until the bleeding stops. ? Place a bandage (dressing) on the cut. Contact a health care provider if:  The skin around your varicose veins starts to break down.  You have pain, redness, tenderness, or hard swelling over a vein.  You   are uncomfortable because of pain.  You get a cut in the skin over a varicose vein and it will not stop bleeding. Summary  Varicose veins are veins that have become enlarged, bulged, and twisted. They most often appear in the legs.  This condition is caused by damage to the valves in the vein. These valves help blood return to your heart.  Treatment for this condition includes frequent movements, wearing compression stockings, losing weight, and  exercising regularly. In some cases, procedures are done to close off or remove the veins.  Treatment for this condition may include wearing compression stockings, elevating the legs, losing weight, and engaging in regular activity. In some cases, procedures are done to close off or remove the veins. This information is not intended to replace advice given to you by your health care provider. Make sure you discuss any questions you have with your health care provider. Document Released: 10/03/2004 Document Revised: 01/17/2016 Document Reviewed: 01/17/2016 Elsevier Interactive Patient Education  2019 Elsevier Inc.  

## 2018-03-20 ENCOUNTER — Other Ambulatory Visit: Payer: Self-pay | Admitting: Adult Health

## 2018-04-08 ENCOUNTER — Encounter: Payer: Self-pay | Admitting: Adult Health

## 2019-03-07 ENCOUNTER — Other Ambulatory Visit: Payer: Self-pay | Admitting: Adult Health

## 2019-04-08 ENCOUNTER — Other Ambulatory Visit: Payer: Self-pay | Admitting: Adult Health

## 2019-04-20 ENCOUNTER — Telehealth: Payer: Self-pay

## 2019-04-20 NOTE — Telephone Encounter (Signed)
Called lmom informing patient need to schedule physical sooner for further medication refills.klh

## 2019-05-08 ENCOUNTER — Other Ambulatory Visit: Payer: Self-pay | Admitting: Adult Health

## 2019-05-27 ENCOUNTER — Telehealth: Payer: Self-pay

## 2019-05-27 NOTE — Telephone Encounter (Signed)
Lmom to confirm and screen for 05-31-19 ov. 

## 2019-05-28 ENCOUNTER — Other Ambulatory Visit: Payer: Self-pay

## 2019-05-28 ENCOUNTER — Ambulatory Visit: Payer: BLUE CROSS/BLUE SHIELD | Attending: Oncology

## 2019-05-28 DIAGNOSIS — Z23 Encounter for immunization: Secondary | ICD-10-CM

## 2019-05-28 NOTE — Progress Notes (Signed)
   Covid-19 Vaccination Clinic  Name:  Donna Donaldson    MRN: [id number] DOB: Nov 29, 1960  05/28/2019  Donna Donaldson was observed post Covid-19 immunization for 15 minutes without incident. She was provided with Vaccine Information Sheet and instruction to access the V-Safe system.   Donna Donaldson was instructed to call 911 with any severe reactions post vaccine: Marland Kitchen Difficulty breathing  . Swelling of face and throat  . A fast heartbeat  . A bad rash all over body  . Dizziness and weakness   Immunizations Administered    Name Date Dose VIS Date Route   Pfizer COVID-19 Vaccine 05/28/2019  9:48 AM 0.3 mL 03/03/2018 Intramuscular   Manufacturer: [employer], [employer]   Lot: [id number]   NDC: [address]-1

## 2019-05-31 ENCOUNTER — Encounter: Payer: Self-pay | Admitting: Nurse Practitioner

## 2019-05-31 ENCOUNTER — Ambulatory Visit (INDEPENDENT_AMBULATORY_CARE_PROVIDER_SITE_OTHER): Payer: BLUE CROSS/BLUE SHIELD | Admitting: Nurse Practitioner

## 2019-05-31 VITALS — BP 123/90 | HR 82 | Temp 97.3°F | Resp 16 | Ht 68.0 in | Wt 161.4 lb

## 2019-05-31 DIAGNOSIS — E559 Vitamin D deficiency, unspecified: Secondary | ICD-10-CM | POA: Diagnosis not present

## 2019-05-31 DIAGNOSIS — I10 Essential (primary) hypertension: Secondary | ICD-10-CM | POA: Diagnosis not present

## 2019-05-31 DIAGNOSIS — Z1231 Encounter for screening mammogram for malignant neoplasm of breast: Secondary | ICD-10-CM

## 2019-05-31 NOTE — Progress Notes (Signed)
[facility] [address] [place], [place] [address]  Internal MEDICINE  Office Visit Note  Patient Name: Donna Donaldson  [id number]  [id number]  Date of Service: 06/06/2019  Chief Complaint  Patient presents with  . Hypertension  . Quality Metric Gaps    Mammogram  . Thyroid Problem    would like thyroid checked    The patient is here for routine follow up. Blood pressure is well managed on current medication. She is overdue to have routine, fasting labs. She is also due to have screening mammogram. She is due to have annual wellness visit and pap smear. She has no concerns or complaints today.       Current Medication: Outpatient Encounter Medications as of 05/31/2019  Medication Sig  . hydrochlorothiazide (HYDRODIURIL) 25 MG tablet TAKE 1 TABLET BY MOUTH EVERY DAY  . lisinopril (ZESTRIL) 20 MG tablet TAKE 1 TABLET(20 MG) BY MOUTH DAILY  . [DISCONTINUED] meloxicam (MOBIC) 15 MG tablet Take 1 tablet (15 mg total) by mouth daily. (Patient not taking: Reported on 05/31/2019)   No facility-administered encounter medications on file as of 05/31/2019.    Surgical History: Past Surgical History:  Procedure Laterality Date  . ABDOMINAL HYSTERECTOMY      Medical History: Past Medical History:  Diagnosis Date  . Hypertension     Family History: Family History  Problem Relation Age of Onset  . Stroke Mother   . Hypertension Mother   . Diabetes Mother   . Diabetes Maternal Aunt   . Lung cancer Maternal Aunt   . Breast cancer Neg Hx     Social History   Socioeconomic History  . Marital status: Single    Spouse name: Not on file  . Number of children: Not on file  . Years of education: Not on file  . Highest education level: Not on file  Occupational History  . Not on file  Tobacco Use  . Smoking status: Never Smoker  . Smokeless tobacco: Never Used  Substance and Sexual Activity  . Alcohol use: Yes    Comment: social  . Drug use: No  . Sexual  activity: Not on file  Other Topics Concern  . Not on file  Social History Narrative  . Not on file   Social Determinants of Health   Financial Resource Strain:   . Difficulty of Paying Living Expenses:   Food Insecurity:   . Worried About Programme researcher, broadcasting/film/video in the Last Year:   . Barista in the Last Year:   Transportation Needs:   . Freight forwarder (Medical):   Marland Kitchen Lack of Transportation (Non-Medical):   Physical Activity:   . Days of Exercise per Week:   . Minutes of Exercise per Session:   Stress:   . Feeling of Stress :   Social Connections:   . Frequency of Communication with Friends and Family:   . Frequency of Social Gatherings with Friends and Family:   . Attends Religious Services:   . Active Member of Clubs or Organizations:   . Attends Banker Meetings:   Marland Kitchen Marital Status:   Intimate Partner Violence:   . Fear of Current or Ex-Partner:   . Emotionally Abused:   Marland Kitchen Physically Abused:   . Sexually Abused:       Review of Systems  Constitutional: Negative for activity change, chills, fatigue and unexpected weight change.  HENT: Negative for congestion, postnasal drip, rhinorrhea, sneezing and sore throat.  Respiratory: Negative for cough, chest tightness, shortness of breath and wheezing.   Cardiovascular: Negative for chest pain and palpitations.  Gastrointestinal: Negative for abdominal pain, constipation, diarrhea, nausea and vomiting.  Endocrine: Negative for cold intolerance, heat intolerance, polydipsia and polyuria.  Musculoskeletal: Negative for arthralgias, back pain, joint swelling and neck pain.  Skin: Negative for rash.  Allergic/Immunologic: Negative for environmental allergies.  Neurological: Negative for dizziness, tremors, numbness and headaches.  Hematological: Negative for adenopathy. Does not bruise/bleed easily.  Psychiatric/Behavioral: Negative for behavioral problems (Depression), sleep disturbance and suicidal  ideas. The patient is not nervous/anxious.    Today's Vitals   05/31/19 1546  BP: 123/90  Pulse: 82  Resp: 16  Temp: (!) 97.3 F (36.3 C)  SpO2: 98%  Weight: 161 lb 6.4 oz (73.2 kg)  Height: 5\' 8"  (1.727 m)   Body mass index is 24.54 kg/m.  Physical Exam Vitals and nursing note reviewed.  Constitutional:      General: She is not in acute distress.    Appearance: Normal appearance. She is well-developed. She is not diaphoretic.  HENT:     Head: Normocephalic and atraumatic.     Nose: Nose normal.     Mouth/Throat:     Pharynx: No oropharyngeal exudate.  Eyes:     Pupils: Pupils are equal, round, and reactive to light.  Neck:     Thyroid: No thyromegaly.     Vascular: No carotid bruit or JVD.     Trachea: No tracheal deviation.  Cardiovascular:     Rate and Rhythm: Normal rate and regular rhythm.     Heart sounds: Normal heart sounds. No murmur. No friction rub. No gallop.   Pulmonary:     Effort: Pulmonary effort is normal. No respiratory distress.     Breath sounds: Normal breath sounds. No wheezing or rales.  Chest:     Chest wall: No tenderness.  Abdominal:     Palpations: Abdomen is soft.  Musculoskeletal:        General: Normal range of motion.     Cervical back: Normal range of motion and neck supple.  Lymphadenopathy:     Cervical: No cervical adenopathy.  Skin:    General: Skin is warm and dry.  Neurological:     Mental Status: She is alert and oriented to person, place, and time.     Cranial Nerves: No cranial nerve deficit.  Psychiatric:        Mood and Affect: Mood normal.        Behavior: Behavior normal.        Thought Content: Thought content normal.        Judgment: Judgment normal.    Assessment/Plan: 1. Essential hypertension Stable. Continue bp medication as prescribed.   2. Vitamin D deficiency Check vitamin d level with routine, fasting labs. Treat deficiency as indicated.   3. Encounter for screening mammogram for malignant  neoplasm of breast - MM DIGITAL SCREENING BILATERAL; Future  General Counseling: Jazmeen verbalizes understanding of the findings of todays visit and agrees with plan of treatment. I have discussed any further diagnostic evaluation that may be needed or ordered today. We also reviewed her medications today. she has been encouraged to call the office with any questions or concerns that should arise related to todays visit.  Hypertension Counseling:   The following hypertensive lifestyle modification were recommended and discussed:  1. Limiting alcohol intake to less than 1 oz/day of ethanol:(24 oz of beer or 8 oz of wine  or 2 oz of 100-proof whiskey). 2. Take baby ASA 81 mg daily. 3. Importance of regular aerobic exercise and losing weight. 4. Reduce dietary saturated fat and cholesterol intake for overall cardiovascular health. 5. Maintaining adequate dietary potassium, calcium, and magnesium intake. 6. Regular monitoring of the blood pressure. 7. Reduce sodium intake to less than 100 mmol/day (less than 2.3 gm of sodium or less than 6 gm of sodium choride)   This patient was seen by Vincent Gros FNP Collaboration with Dr Lyndon Code as a part of collaborative care agreement  Orders Placed This Encounter  Procedures  . MM DIGITAL SCREENING BILATERAL      Total time spent: 20 Minutes   Time spent includes review of chart, medications, test results, and follow up plan with the patient.      Dr Lyndon Code Internal medicine

## 2019-06-06 DIAGNOSIS — Z1231 Encounter for screening mammogram for malignant neoplasm of breast: Secondary | ICD-10-CM | POA: Insufficient documentation

## 2019-06-18 ENCOUNTER — Other Ambulatory Visit: Payer: Self-pay

## 2019-06-18 MED ORDER — LISINOPRIL 20 MG PO TABS: ORAL_TABLET | ORAL | 3 refills | Status: DC

## 2019-06-19 ENCOUNTER — Ambulatory Visit: Payer: BLUE CROSS/BLUE SHIELD | Attending: Internal Medicine

## 2019-06-19 DIAGNOSIS — Z23 Encounter for immunization: Secondary | ICD-10-CM

## 2019-06-19 NOTE — Progress Notes (Signed)
   Covid-19 Vaccination Clinic  Name:  Donna Donaldson    MRN: [id number] DOB: 05-01-1960  06/19/2019  [name] was observed post Covid-19 immunization for 15 minutes without incident. She was provided with Vaccine Information Sheet and instruction to access the V-Safe system.   [name] was instructed to call 911 with any severe reactions post vaccine: Marland Kitchen Difficulty breathing  . Swelling of face and throat  . A fast heartbeat  . A bad rash all over body  . Dizziness and weakness   Immunizations Administered    Name Date Dose VIS Date Route   Pfizer COVID-19 Vaccine 06/19/2019 10:16 AM 0.3 mL 03/03/2018 Intramuscular   Manufacturer: [employer], [employer]   Lot: [id number]   NDC: [address]-2

## 2019-07-15 ENCOUNTER — Other Ambulatory Visit: Payer: Self-pay | Admitting: Nurse Practitioner

## 2019-07-16 LAB — LIPID PANEL WITH LDL/HDL RATIO
Cholesterol, Total: 184 mg/dL (ref 100–199)
HDL: 62 mg/dL (ref >39)
LDL Chol Calc (NIH): 106 mg/dL — ABNORMAL HIGH (ref 0–99)
LDL/HDL Ratio: 1.7 ratio (ref 0.0–3.2)
Triglycerides: 85 mg/dL (ref 0–149)
VLDL Cholesterol Cal: 16 mg/dL (ref 5–40)

## 2019-07-16 LAB — CBC
Hematocrit: 39.4 % (ref 34.0–46.6)
Hemoglobin: 13.7 g/dL (ref 11.1–15.9)
MCH: 32.2 pg (ref 26.6–33.0)
MCHC: 34.8 g/dL (ref 31.5–35.7)
MCV: 93 fL (ref 79–97)
Platelets: 233 10*3/uL (ref 150–450)
RBC: 4.26 x10E6/uL (ref 3.77–5.28)
RDW: 12.4 % (ref 11.7–15.4)
WBC: 2.6 10*3/uL — ABNORMAL LOW (ref 3.4–10.8)

## 2019-07-16 LAB — COMPREHENSIVE METABOLIC PANEL
ALT: 27 IU/L (ref 0–32)
AST: 21 IU/L (ref 0–40)
Albumin/Globulin Ratio: 1.4 (ref 1.2–2.2)
Albumin: 4.2 g/dL (ref 3.8–4.8)
Alkaline Phosphatase: 77 IU/L (ref 48–121)
BUN/Creatinine Ratio: 29 — ABNORMAL HIGH (ref 12–28)
BUN: 22 mg/dL (ref 8–27)
Bilirubin Total: 0.5 mg/dL (ref 0.0–1.2)
CO2: 25 mmol/L (ref 20–29)
Calcium: 9.7 mg/dL (ref 8.7–10.3)
Chloride: 105 mmol/L (ref 96–106)
Creatinine, Ser: 0.76 mg/dL (ref 0.57–1.00)
GFR calc Af Amer: 98 mL/min/{1.73_m2} (ref >59)
GFR calc non Af Amer: 85 mL/min/{1.73_m2} (ref >59)
Globulin, Total: 3 g/dL (ref 1.5–4.5)
Glucose: 91 mg/dL (ref 65–99)
Potassium: 4 mmol/L (ref 3.5–5.2)
Sodium: 142 mmol/L (ref 134–144)
Total Protein: 7.2 g/dL (ref 6.0–8.5)

## 2019-07-16 LAB — T4, FREE: Free T4: 0.95 ng/dL (ref 0.82–1.77)

## 2019-07-16 LAB — TSH: TSH: 1.99 u[IU]/mL (ref 0.450–4.500)

## 2019-07-16 LAB — HCV INTERPRETATION

## 2019-07-16 LAB — VITAMIN D 25 HYDROXY (VIT D DEFICIENCY, FRACTURES): Vit D, 25-Hydroxy: 28 ng/mL — ABNORMAL LOW (ref 30.0–100.0)

## 2019-07-16 LAB — HCV AB W REFLEX TO QUANT PCR: HCV Ab: <0.1 s/co ratio (ref 0.0–0.9)

## 2019-07-25 NOTE — Progress Notes (Signed)
Decreased WBC. Discuss with patient 08/02/2019

## 2019-07-29 ENCOUNTER — Telehealth: Payer: Self-pay

## 2019-07-29 NOTE — Telephone Encounter (Signed)
Confirmed and screened for 08-02-19 ov. 

## 2019-08-02 ENCOUNTER — Encounter: Payer: Self-pay | Admitting: Nurse Practitioner

## 2019-08-02 ENCOUNTER — Ambulatory Visit (INDEPENDENT_AMBULATORY_CARE_PROVIDER_SITE_OTHER): Payer: BLUE CROSS/BLUE SHIELD | Admitting: Nurse Practitioner

## 2019-08-02 ENCOUNTER — Other Ambulatory Visit: Payer: Self-pay

## 2019-08-02 VITALS — BP 134/87 | HR 77 | Temp 97.5°F | Resp 16 | Ht 68.0 in | Wt 160.2 lb

## 2019-08-02 DIAGNOSIS — D709 Neutropenia, unspecified: Secondary | ICD-10-CM

## 2019-08-02 DIAGNOSIS — Z0001 Encounter for general adult medical examination with abnormal findings: Secondary | ICD-10-CM | POA: Diagnosis not present

## 2019-08-02 DIAGNOSIS — E559 Vitamin D deficiency, unspecified: Secondary | ICD-10-CM | POA: Diagnosis not present

## 2019-08-02 DIAGNOSIS — Z124 Encounter for screening for malignant neoplasm of cervix: Secondary | ICD-10-CM | POA: Diagnosis not present

## 2019-08-02 DIAGNOSIS — I10 Essential (primary) hypertension: Secondary | ICD-10-CM

## 2019-08-02 DIAGNOSIS — R300 Dysuria: Secondary | ICD-10-CM

## 2019-08-02 MED ORDER — HYDROCHLOROTHIAZIDE 25 MG PO TABS: 25.0000 mg | ORAL_TABLET | Freq: Every day | ORAL | 5 refills | Status: DC

## 2019-08-02 MED ORDER — VITAMIN D (ERGOCALCIFEROL) 1.25 MG (50000 UNIT) PO CAPS: 50000.0000 [IU] | ORAL_CAPSULE | ORAL | 5 refills | Status: DC

## 2019-08-02 MED ORDER — LISINOPRIL 20 MG PO TABS: ORAL_TABLET | ORAL | 5 refills | Status: DC

## 2019-08-02 NOTE — Progress Notes (Signed)
[facility] [place], [place] [address]  Internal MEDICINE  Office Visit Note  Patient Name: Donna Donaldson  [id number]  [id number]  Date of Service: 08/28/2019   Pt is here for routine health maintenance examination  Chief Complaint  Patient presents with  . Annual Exam    Papsmear  . Hypertension  . Quality Metric Gaps    TDAP  . Rash    Has itchy bumps on arms and back at times, not consistent     The patient is here for health maintenance exam and pap smear. She has noted sparse dry skin spots which itch, present on her forearms and trunk. They come and go. They don't hurt, but they do itch. Cannot think of anything which makes them better or worse .her blood pressure is well managed.  Had routine, fasting labs done. Low WBC which was also low last year. This time, WBC is 2.6. all other values of CBC are normal. Vitamin d is mildly low also. Her LDL is 106 with remaining lipid panel was normal. She is due to have her screening mammogram which was ordered but needs to be scheduled.     Current Medication: Outpatient Encounter Medications as of 08/02/2019  Medication Sig  . hydrochlorothiazide (HYDRODIURIL) 25 MG tablet Take 1 tablet (25 mg total) by mouth daily.  Marland Kitchen lisinopril (ZESTRIL) 20 MG tablet TAKE 1 TABLET(20 MG) BY MOUTH DAILY  . [DISCONTINUED] hydrochlorothiazide (HYDRODIURIL) 25 MG tablet TAKE 1 TABLET BY MOUTH EVERY DAY  . [DISCONTINUED] lisinopril (ZESTRIL) 20 MG tablet TAKE 1 TABLET(20 MG) BY MOUTH DAILY  . Vitamin D, Ergocalciferol, (DRISDOL) 1.25 MG (50000 UNIT) CAPS capsule Take 1 capsule (50,000 Units total) by mouth every 7 (seven) days.   No facility-administered encounter medications on file as of 08/02/2019.    Surgical History: Past Surgical History:  Procedure Laterality Date  . ABDOMINAL HYSTERECTOMY      Medical History: Past Medical History:  Diagnosis Date  . Hypertension     Family History: Family History   Problem Relation Age of Onset  . Stroke Mother   . Hypertension Mother   . Diabetes Mother   . Diabetes Maternal Aunt   . Lung cancer Maternal Aunt   . Breast cancer Neg Hx       Review of Systems  Constitutional: Negative for activity change, chills, fatigue and unexpected weight change.  HENT: Negative for congestion, postnasal drip, rhinorrhea, sneezing and sore throat.   Respiratory: Negative for cough, chest tightness, shortness of breath and wheezing.   Cardiovascular: Negative for chest pain and palpitations.  Gastrointestinal: Negative for abdominal pain, constipation, diarrhea, nausea and vomiting.  Endocrine: Negative for cold intolerance, heat intolerance, polydipsia and polyuria.  Genitourinary: Negative for dysuria, frequency, hematuria and urgency.  Musculoskeletal: Negative for arthralgias, back pain, joint swelling and neck pain.  Skin: Positive for rash.  Neurological: Negative for dizziness, tremors, numbness and headaches.  Hematological: Negative for adenopathy. Does not bruise/bleed easily.  Psychiatric/Behavioral: Negative for behavioral problems (Depression), sleep disturbance and suicidal ideas. The patient is not nervous/anxious.      Today's Vitals   08/02/19 0851  BP: (!) 134/87  Pulse: 77  Resp: 16  Temp: (!) 97.5 F (36.4 C)  SpO2: 100%  Weight: 160 lb 3.2 oz (72.7 kg)  Height: '5\' 8"'$  (1.727 m)   Body mass index is 24.36 kg/m.  Physical Exam Vitals and nursing note reviewed.  Constitutional:      General: She is  not in acute distress.    Appearance: Normal appearance. She is well-developed. She is not diaphoretic.  HENT:     Head: Normocephalic and atraumatic.     Nose: Nose normal.     Mouth/Throat:     Pharynx: No oropharyngeal exudate.  Eyes:     Pupils: Pupils are equal, round, and reactive to light.  Neck:     Thyroid: No thyromegaly.     Vascular: No carotid bruit or JVD.     Trachea: No tracheal deviation.  Cardiovascular:      Rate and Rhythm: Normal rate and regular rhythm.     Pulses: Normal pulses.     Heart sounds: Normal heart sounds. No murmur heard.  No friction rub. No gallop.   Pulmonary:     Effort: Pulmonary effort is normal. No respiratory distress.     Breath sounds: Normal breath sounds. No wheezing or rales.  Chest:     Chest wall: No tenderness.     Breasts:        Right: Normal. No swelling, bleeding, inverted nipple, mass, nipple discharge, skin change or tenderness.        Left: Normal. No swelling, bleeding, inverted nipple, mass, nipple discharge, skin change or tenderness.  Abdominal:     General: Bowel sounds are normal.     Palpations: Abdomen is soft.     Tenderness: There is no abdominal tenderness.     Hernia: There is no hernia in the left inguinal area or right inguinal area.  Genitourinary:    General: Normal vulva.     Labia:        Right: No tenderness or lesion.        Left: No tenderness or lesion.      Vagina: Normal. No vaginal discharge, erythema or tenderness.     Cervix: No discharge, friability or erythema.     Uterus: Normal.      Adnexa: Right adnexa normal and left adnexa normal.     Comments: No tenderness, masses, or organomeglay present during bimanual exam . Musculoskeletal:        General: Normal range of motion.     Cervical back: Normal range of motion and neck supple.  Lymphadenopathy:     Cervical: No cervical adenopathy.     Upper Body:     Right upper body: No axillary adenopathy.     Left upper body: No axillary adenopathy.     Lower Body: No right inguinal adenopathy. No left inguinal adenopathy.  Skin:    General: Skin is warm and dry.  Neurological:     General: No focal deficit present.     Mental Status: She is alert and oriented to person, place, and time.     Cranial Nerves: No cranial nerve deficit.  Psychiatric:        Mood and Affect: Mood normal.        Behavior: Behavior normal.        Thought Content: Thought content  normal.        Judgment: Judgment normal.      LABS: Recent Results (from the past 2160 hour(s))  Comprehensive metabolic panel     Status: Abnormal   Collection Time: 07/15/19 11:21 AM  Result Value Ref Range   Glucose 91 65 - 99 mg/dL   BUN 22 8 - 27 mg/dL   Creatinine, Ser 0.76 0.57 - 1.00 mg/dL   GFR calc non Af Amer 85 >59 mL/min/1.73   GFR calc  Af Amer 98 >59 mL/min/1.73    Comment: **[facility] currently reports eGFR in compliance with the current**   recommendations of the [employer]. [facility] will   update reporting as new guidelines are published from the NKF-ASN   Task force.    BUN/Creatinine Ratio 29 (H) 12 - 28   Sodium 142 134 - 144 mmol/L   Potassium 4.0 3.5 - 5.2 mmol/L   Chloride 105 96 - 106 mmol/L   CO2 25 20 - 29 mmol/L   Calcium 9.7 8.7 - 10.3 mg/dL   Total Protein 7.2 6.0 - 8.5 g/dL   Albumin 4.2 3.8 - 4.8 g/dL   Globulin, Total 3.0 1.5 - 4.5 g/dL   Albumin/Globulin Ratio 1.4 1.2 - 2.2   Bilirubin Total 0.5 0.0 - 1.2 mg/dL   Alkaline Phosphatase 77 48 - 121 IU/L   AST 21 0 - 40 IU/L   ALT 27 0 - 32 IU/L  CBC     Status: Abnormal   Collection Time: 07/15/19 11:21 AM  Result Value Ref Range   WBC 2.6 (L) 3.4 - 10.8 x10E3/uL   RBC 4.26 3.77 - 5.28 x10E6/uL   Hemoglobin 13.7 11.1 - 15.9 g/dL   Hematocrit 11.5 72.6 - 46.6 %   MCV 93 79 - 97 fL   MCH 32.2 26.6 - 33.0 pg   MCHC 34.8 31 - 35 g/dL   RDW 20.3 55.9 - 74.1 %   Platelets 233 150 - 450 x10E3/uL  Lipid Panel With LDL/HDL Ratio     Status: Abnormal   Collection Time: 07/15/19 11:21 AM  Result Value Ref Range   Cholesterol, Total 184 100 - 199 mg/dL   Triglycerides 85 0 - 149 mg/dL   HDL 62 >63 mg/dL   VLDL Cholesterol Cal 16 5 - 40 mg/dL   LDL Chol Calc (NIH) 845 (H) 0 - 99 mg/dL   LDL/HDL Ratio 1.7 0.0 - 3.2 ratio    Comment:                                     LDL/HDL Ratio                                             Men  Women                               1/2 Avg.Risk   1.0    1.5                                   Avg.Risk  3.6    3.2                                2X Avg.Risk  6.2    5.0                                3X Avg.Risk  8.0    6.1   HCV Ab w Reflex to Quant PCR     Status: None   Collection Time: 07/15/19  11:21 AM  Result Value Ref Range   HCV Ab <0.1 0.0 - 0.9 s/co ratio  Interpretation:     Status: None   Collection Time: 07/15/19 11:21 AM  Result Value Ref Range   HCV Interp 1: Comment     Comment: Negative Not infected with HCV, unless recent infection is suspected or other evidence exists to indicate HCV infection.   T4, free     Status: None   Collection Time: 07/15/19 11:21 AM  Result Value Ref Range   Free T4 0.95 0.82 - 1.77 ng/dL  TSH     Status: None   Collection Time: 07/15/19 11:21 AM  Result Value Ref Range   TSH 1.990 0.450 - 4.500 uIU/mL  VITAMIN D 25 Hydroxy (Vit-D Deficiency, Fractures)     Status: Abnormal   Collection Time: 07/15/19 11:21 AM  Result Value Ref Range   Vit D, 25-Hydroxy 28.0 (L) 30.0 - 100.0 ng/mL    Comment: Vitamin D deficiency has been defined by the [place] and an Endocrine Society practice guideline as a level of serum 25-OH vitamin D less than 20 ng/mL (1,2). The Endocrine Society went on to further define vitamin D insufficiency as a level between 21 and 29 ng/mL (2). 1. IOM (Institute of Medicine). 2010. Dietary reference    intakes for calcium and D. [place]: The    [employer]. 2. Holick MF, [place], Bischoff-Ferrari HA, et al.    Evaluation, treatment, and prevention of vitamin D    deficiency: an Endocrine Society clinical practice    guideline. JCEM. 2011 Jul; 96(7):1911-30.   UA/M w/rflx Culture, Routine     Status: Abnormal   Collection Time: 08/02/19  8:54 AM   Specimen: Urine   Urine  Result Value Ref Range   Specific Gravity, UA 1.025 1.005 - 1.030   pH, UA 5.5 5.0 - 7.5   Color, UA Yellow Yellow   Appearance Ur Clear Clear    Leukocytes,UA 1+ (A) Negative   Protein,UA Negative Negative/Trace   Glucose, UA Negative Negative   Ketones, UA Trace (A) Negative   RBC, UA Negative Negative   Bilirubin, UA Negative Negative   Urobilinogen, Ur 0.2 0.2 - 1.0 mg/dL   Nitrite, UA Negative Negative   Microscopic Examination See below:     Comment: Microscopic was indicated and was performed.   Urinalysis Reflex Comment     Comment: This specimen has reflexed to a Urine Culture.  IGP, Aptima HPV     Status: None   Collection Time: 08/02/19  8:54 AM  Result Value Ref Range   Interpretation NILM     Comment: NEGATIVE FOR INTRAEPITHELIAL LESION OR MALIGNANCY.   Category NIL     Comment: Negative for Intraepithelial Lesion   Adequacy ENDO     Comment: Satisfactory for evaluation. Endocervical and/or squamous metaplastic cells (endocervical component) are present.    Clinician Provided ICD10 Comment     Comment: Z12.4   Performed by: Comment     Comment: Nori Riis, Cytotechnologist (ASCP)   Note: Comment     Comment: The Pap smear is a screening test designed to aid in the detection of premalignant and malignant conditions of the uterine cervix.  It is not a diagnostic procedure and should not be used as the sole means of detecting cervical cancer.  Both false-positive and false-negative reports do occur.    Test Methodology Comment     Comment: This liquid based ThinPrep(R) pap test  was screened with the use of an image guided system.    HPV Aptima Negative Negative    Comment: This nucleic acid amplification test detects fourteen high-risk HPV types (16,18,31,33,35,39,45,51,52,56,58,59,66,68) without differentiation.   Microscopic Examination     Status: Abnormal   Collection Time: 08/02/19  8:54 AM   Urine  Result Value Ref Range   WBC, UA 0-5 0 - 5 /hpf   RBC None seen 0 - 2 /hpf   Epithelial Cells (non renal) 0-10 0 - 10 /hpf   Casts None seen None seen /lpf   Bacteria, UA Moderate (A) None  seen/Few  Urine Culture, Reflex     Status: Abnormal   Collection Time: 08/02/19  8:54 AM   Urine  Result Value Ref Range   Urine Culture, Routine Final report (A)    Organism ID, Bacteria Escherichia coli (A)     Comment: Greater than 100,000 colony forming units per mL Cefazolin <=4 ug/mL Cefazolin with an MIC <=16 predicts susceptibility to the oral agents cefaclor, cefdinir, cefpodoxime, cefprozil, cefuroxime, cephalexin, and loracarbef when used for therapy of uncomplicated urinary tract infections due to E. coli, Klebsiella pneumoniae, and Proteus mirabilis.    Antimicrobial Susceptibility Comment     Comment:       ** S = Susceptible; I = Intermediate; R = Resistant **                    P = Positive; N = Negative             MICS are expressed in micrograms per mL    Antibiotic                 RSLT#1    RSLT#2    RSLT#3    RSLT#4 Amoxicillin/Clavulanic Acid    S Ampicillin                     S Cefepime                       S Ceftriaxone                    S Cefuroxime                     S Ciprofloxacin                  S Ertapenem                      S Gentamicin                     S Imipenem                       S Levofloxacin                   S Meropenem                      S Nitrofurantoin                 S Piperacillin/Tazobactam        S Tetracycline                   S Tobramycin  S Trimethoprim/Sulfa             S     Assessment/Plan: 1. Encounter for general adult medical examination with abnormal findings Annual health maintenance exam with pap smear today.   2. Essential hypertension Stable. Continue lisinopril and HCTZ as prescribed. Refills provided today.  - hydrochlorothiazide (HYDRODIURIL) 25 MG tablet; Take 1 tablet (25 mg total) by mouth daily.  Dispense: 30 tablet; Refill: 5 - lisinopril (ZESTRIL) 20 MG tablet; TAKE 1 TABLET(20 MG) BY MOUTH DAILY  Dispense: 30 tablet; Refill: 5  3. Neutropenia, unspecified type  ([place]) Persistent. Will recheck and send to hematology for further evaluation as indicated.   4. Vitamin D deficiency Treat with drisdol 50000 iu weekly.  - Vitamin D, Ergocalciferol, (DRISDOL) 1.25 MG (50000 UNIT) CAPS capsule; Take 1 capsule (50,000 Units total) by mouth every 7 (seven) days.  Dispense: 5 capsule; Refill: 5  5. Routine cervical smear - IGP, Aptima HPV  6. Dysuria - UA/M w/rflx Culture, Routine  General Counseling: Solina verbalizes understanding of the findings of todays visit and agrees with plan of treatment. I have discussed any further diagnostic evaluation that may be needed or ordered today. We also reviewed her medications today. she has been encouraged to call the office with any questions or concerns that should arise related to todays visit.    Counseling:  This patient was seen by Leretha Pol FNP Collaboration with Dr Lavera Guise as a part of collaborative care agreement  Orders Placed This Encounter  Procedures  . Microscopic Examination  . Urine Culture, Reflex  . UA/M w/rflx Culture, Routine    Meds ordered this encounter  Medications  . Vitamin D, Ergocalciferol, (DRISDOL) 1.25 MG (50000 UNIT) CAPS capsule    Sig: Take 1 capsule (50,000 Units total) by mouth every 7 (seven) days.    Dispense:  5 capsule    Refill:  5    Order Specific Question:   Supervising Provider    Answer:   Lavera Guise [[id number]]  . hydrochlorothiazide (HYDRODIURIL) 25 MG tablet    Sig: Take 1 tablet (25 mg total) by mouth daily.    Dispense:  30 tablet    Refill:  5    Order Specific Question:   Supervising Provider    Answer:   Lavera Guise [[id number]]  . lisinopril (ZESTRIL) 20 MG tablet    Sig: TAKE 1 TABLET(20 MG) BY MOUTH DAILY    Dispense:  30 tablet    Refill:  5    Order Specific Question:   Supervising Provider    Answer:   Lavera Guise [[id number]]    Total time spent: 83 Minutes  Time spent includes review of chart, medications, test results, and follow up  plan with the patient.     Lavera Guise, MD  Internal Medicine

## 2019-08-04 LAB — IGP, APTIMA HPV: HPV Aptima: NEGATIVE

## 2019-08-05 LAB — MICROSCOPIC EXAMINATION
Casts: NONE SEEN /lpf
RBC, Urine: NONE SEEN /hpf (ref 0–2)

## 2019-08-05 LAB — UA/M W/RFLX CULTURE, ROUTINE
Bilirubin, UA: NEGATIVE
Glucose, UA: NEGATIVE
Nitrite, UA: NEGATIVE
Protein,UA: NEGATIVE
RBC, UA: NEGATIVE
Specific Gravity, UA: 1.025 (ref 1.005–1.030)
Urobilinogen, Ur: 0.2 mg/dL (ref 0.2–1.0)
pH, UA: 5.5 (ref 5.0–7.5)

## 2019-08-05 LAB — URINE CULTURE, REFLEX

## 2019-08-16 ENCOUNTER — Other Ambulatory Visit: Payer: Self-pay

## 2019-08-16 ENCOUNTER — Telehealth: Payer: Self-pay

## 2019-08-16 MED ORDER — CIPROFLOXACIN HCL 500 MG PO TABS: 500.0000 mg | ORAL_TABLET | Freq: Two times a day (BID) | ORAL | 0 refills | Status: DC

## 2019-08-16 NOTE — Telephone Encounter (Signed)
PT advised bacteria in urine sent in prescription for cipro

## 2019-08-16 NOTE — Telephone Encounter (Signed)
-----   Message from Lyndon Code, MD sent at 08/14/2019  7:40 AM EDT ----- Call in cipro 500 mg po bid x 10 days for urine cx positive for bacteria  ----- Message ----- From: Interface, [facility] Lab Results In Sent: 08/03/2019   9:36 AM EDT To: Carlean Jews, NP

## 2019-08-28 DIAGNOSIS — Z124 Encounter for screening for malignant neoplasm of cervix: Secondary | ICD-10-CM | POA: Insufficient documentation

## 2019-08-28 DIAGNOSIS — D709 Neutropenia, unspecified: Secondary | ICD-10-CM | POA: Insufficient documentation

## 2019-08-31 ENCOUNTER — Other Ambulatory Visit: Payer: Self-pay | Admitting: Nurse Practitioner

## 2019-08-31 ENCOUNTER — Telehealth: Payer: Self-pay

## 2019-08-31 DIAGNOSIS — D709 Neutropenia, unspecified: Secondary | ICD-10-CM

## 2019-08-31 NOTE — Telephone Encounter (Signed)
I placed order in epic and she can have new cbc drawn at any [facility].

## 2019-09-01 NOTE — Telephone Encounter (Signed)
Left pt a vm informing her that lab order was made from [facility].  dbs

## 2019-09-16 LAB — CBC WITH DIFFERENTIAL/PLATELET
Basophils Absolute: 0 10*3/uL (ref 0.0–0.2)
Basos: 1 %
EOS (ABSOLUTE): 0.1 10*3/uL (ref 0.0–0.4)
Eos: 4 %
Hematocrit: 39.6 % (ref 34.0–46.6)
Hemoglobin: 13.1 g/dL (ref 11.1–15.9)
Immature Grans (Abs): 0 10*3/uL (ref 0.0–0.1)
Immature Granulocytes: 0 %
Lymphocytes Absolute: 1.6 10*3/uL (ref 0.7–3.1)
Lymphs: 61 %
MCH: 31.1 pg (ref 26.6–33.0)
MCHC: 33.1 g/dL (ref 31.5–35.7)
MCV: 94 fL (ref 79–97)
Monocytes Absolute: 0.2 10*3/uL (ref 0.1–0.9)
Monocytes: 9 %
Neutrophils Absolute: 0.7 10*3/uL — ABNORMAL LOW (ref 1.4–7.0)
Neutrophils: 25 %
Platelets: 233 10*3/uL (ref 150–450)
RBC: 4.21 x10E6/uL (ref 3.77–5.28)
RDW: 12.1 % (ref 11.7–15.4)
WBC: 2.7 10*3/uL — ABNORMAL LOW (ref 3.4–10.8)

## 2019-10-17 NOTE — Progress Notes (Signed)
Hey. Please let the patient know that her WBC is still low at 2.7. was 2.6 at prior check. I would like to refer her to hematology for further evaluation. Thanks. (can we go ahead and get this started for her). Thanks.

## 2019-10-26 ENCOUNTER — Inpatient Hospital Stay: Payer: BLUE CROSS/BLUE SHIELD

## 2019-10-26 ENCOUNTER — Encounter (INDEPENDENT_AMBULATORY_CARE_PROVIDER_SITE_OTHER): Payer: Self-pay

## 2019-10-26 ENCOUNTER — Encounter: Payer: Self-pay | Admitting: Oncology

## 2019-10-26 ENCOUNTER — Other Ambulatory Visit: Payer: Self-pay

## 2019-10-26 ENCOUNTER — Inpatient Hospital Stay: Payer: BLUE CROSS/BLUE SHIELD | Attending: Oncology | Admitting: Oncology

## 2019-10-26 VITALS — BP 148/94 | HR 68 | Temp 98.4°F | Resp 18 | Wt 160.2 lb

## 2019-10-26 DIAGNOSIS — D72819 Decreased white blood cell count, unspecified: Secondary | ICD-10-CM | POA: Insufficient documentation

## 2019-10-26 DIAGNOSIS — Z79899 Other long term (current) drug therapy: Secondary | ICD-10-CM | POA: Insufficient documentation

## 2019-10-26 DIAGNOSIS — I10 Essential (primary) hypertension: Secondary | ICD-10-CM | POA: Diagnosis not present

## 2019-10-26 DIAGNOSIS — Z801 Family history of malignant neoplasm of trachea, bronchus and lung: Secondary | ICD-10-CM | POA: Insufficient documentation

## 2019-10-26 LAB — COMPREHENSIVE METABOLIC PANEL
ALT: 26 U/L (ref 0–44)
AST: 23 U/L (ref 15–41)
Albumin: 4.2 g/dL (ref 3.5–5.0)
Alkaline Phosphatase: 60 U/L (ref 38–126)
Anion gap: 8 (ref 5–15)
BUN: 22 mg/dL (ref 8–23)
CO2: 28 mmol/L (ref 22–32)
Calcium: 9.1 mg/dL (ref 8.9–10.3)
Chloride: 101 mmol/L (ref 98–111)
Creatinine, Ser: 0.83 mg/dL (ref 0.44–1.00)
GFR, Estimated: >60 mL/min (ref >60)
Glucose, Bld: 94 mg/dL (ref 70–99)
Potassium: 3.8 mmol/L (ref 3.5–5.1)
Sodium: 137 mmol/L (ref 135–145)
Total Bilirubin: 0.7 mg/dL (ref 0.3–1.2)
Total Protein: 7.5 g/dL (ref 6.5–8.1)

## 2019-10-26 LAB — CBC WITH DIFFERENTIAL/PLATELET
Abs Immature Granulocytes: 0.01 10*3/uL (ref 0.00–0.07)
Basophils Absolute: 0 10*3/uL (ref 0.0–0.1)
Basophils Relative: 1 %
Eosinophils Absolute: 0.1 10*3/uL (ref 0.0–0.5)
Eosinophils Relative: 3 %
HCT: 38.4 % (ref 36.0–46.0)
Hemoglobin: 12.9 g/dL (ref 12.0–15.0)
Immature Granulocytes: 0 %
Lymphocytes Relative: 50 %
Lymphs Abs: 1.5 10*3/uL (ref 0.7–4.0)
MCH: 31.6 pg (ref 26.0–34.0)
MCHC: 33.6 g/dL (ref 30.0–36.0)
MCV: 94.1 fL (ref 80.0–100.0)
Monocytes Absolute: 0.2 10*3/uL (ref 0.1–1.0)
Monocytes Relative: 7 %
Neutro Abs: 1.2 10*3/uL — ABNORMAL LOW (ref 1.7–7.7)
Neutrophils Relative %: 39 %
Platelets: 217 10*3/uL (ref 150–400)
RBC: 4.08 MIL/uL (ref 3.87–5.11)
RDW: 11.7 % (ref 11.5–15.5)
WBC: 2.9 10*3/uL — ABNORMAL LOW (ref 4.0–10.5)
nRBC: 0 % (ref 0.0–0.2)

## 2019-10-26 LAB — FOLATE: Folate: 26 ng/mL (ref >5.9)

## 2019-10-26 LAB — TECHNOLOGIST SMEAR REVIEW

## 2019-10-26 LAB — HIV ANTIBODY (ROUTINE TESTING W REFLEX): HIV Screen 4th Generation wRfx: NONREACTIVE

## 2019-10-26 LAB — VITAMIN B12: Vitamin B-12: 250 pg/mL (ref 180–914)

## 2019-10-26 LAB — LACTATE DEHYDROGENASE: LDH: 151 U/L (ref 98–192)

## 2019-10-26 LAB — HEPATITIS B SURFACE ANTIGEN: Hepatitis B Surface Ag: NONREACTIVE

## 2019-10-26 NOTE — Progress Notes (Signed)
New patient evaluation.   

## 2019-10-26 NOTE — Progress Notes (Signed)
Hematology/Oncology Consult note [facility] [phone] [phone]   Patient Care Team: Carlean Jews, NP as PCP - General (Family Medicine)  REFERRING PROVIDER: Carlean Jews, NP  CHIEF COMPLAINTS/REASON FOR VISIT:  Evaluation of leukopenia  HISTORY OF PRESENTING ILLNESS:  Donna Donaldson is a 59 y.o. female who was seen in consultation at the request of Carlean Jews, NP for evaluation of leukopenia. Reviewed patient's recent labs.  None 08/2019 patient has low total WBC count was 2.7, predominantly neutropenia with ANC 0.7. Previous lab records reviewed. 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 No aggravating or improving factors.  Associated symptoms:  denies fatigue, weight loss, fever, chills, frequent infection.  History hepatitis or HIV infection: Denies History of chronic liver disease: Denies History of blood transfusion: Denies Alcohol consumption: Denies Diet Vegetarian or Vegan: Denies Herbal medication: Denies    Review of Systems  Constitutional: Negative for appetite change, chills, fatigue and fever.  HENT:   Negative for hearing loss and voice change.   Eyes: Negative for eye problems.  Respiratory: Negative for chest tightness and cough.   Cardiovascular: Negative for chest pain.  Gastrointestinal: Negative for abdominal distention, abdominal pain and blood in stool.  Endocrine: Negative for hot flashes.  Genitourinary: Negative for difficulty urinating and frequency.   Musculoskeletal: Negative for arthralgias.  Skin: Negative for itching and rash.  Neurological: Negative for extremity weakness.  Hematological: Negative for adenopathy.  Psychiatric/Behavioral: Negative for confusion.    MEDICAL HISTORY:  Past Medical History:  Diagnosis Date  . Hypertension     SURGICAL HISTORY: Past Surgical History:  Procedure Laterality Date  . ABDOMINAL HYSTERECTOMY       SOCIAL HISTORY: Social History   Socioeconomic History  . Marital status: Single    Spouse name: Not on file  . Number of children: Not on file  . Years of education: Not on file  . Highest education level: Not on file  Occupational History  . Not on file  Tobacco Use  . Smoking status: Never Smoker  . Smokeless tobacco: Never Used  Substance and Sexual Activity  . Alcohol use: Yes    Comment: social  . Drug use: No  . Sexual activity: Not on file  Other Topics Concern  . Not on file  Social History Narrative  . Not on file   Social Determinants of Health   Financial Resource Strain:   . Difficulty of Paying Living Expenses: Not on file  Food Insecurity:   . Worried About Programme researcher, broadcasting/film/video in the Last Year: Not on file  . Ran Out of Food in the Last Year: Not on file  Transportation Needs:   . Lack of Transportation (Medical): Not on file  . Lack of Transportation (Non-Medical): Not on file  Physical Activity:   . Days of Exercise per Week: Not on file  . Minutes of Exercise per Session: Not on file  Stress:   . Feeling of Stress : Not on file  Social Connections:   . Frequency of Communication with Friends and Family: Not on file  . Frequency of Social Gatherings with Friends and Family: Not on file  . Attends Religious Services: Not on file  . Active Member of Clubs or Organizations: Not on file  . Attends Banker Meetings: Not on file  . Marital Status: Not on file  Intimate Partner Violence:   . Fear of Current or Ex-Partner: Not on file  .  Emotionally Abused: Not on file  . Physically Abused: Not on file  . Sexually Abused: Not on file    FAMILY HISTORY: Family History  Problem Relation Age of Onset  . Stroke Mother   . Hypertension Mother   . Diabetes Mother   . Diabetes Maternal Aunt   . Lung cancer Maternal Aunt   . Lung disease Maternal Aunt   . Bone cancer Maternal Grandfather   . Breast cancer Neg Hx     ALLERGIES:  has  No Known Allergies.  MEDICATIONS:  Current Outpatient Medications  Medication Sig Dispense Refill  . hydrochlorothiazide (HYDRODIURIL) 25 MG tablet Take 1 tablet (25 mg total) by mouth daily. 30 tablet 5  . lisinopril (ZESTRIL) 20 MG tablet TAKE 1 TABLET(20 MG) BY MOUTH DAILY 30 tablet 5  . Vitamin D, Ergocalciferol, (DRISDOL) 1.25 MG (50000 UNIT) CAPS capsule Take 1 capsule (50,000 Units total) by mouth every 7 (seven) days. 5 capsule 5  . ciprofloxacin (CIPRO) 500 MG tablet Take 1 tablet (500 mg total) by mouth 2 (two) times daily. (Patient not taking: Reported on 10/26/2019) 20 tablet 0   No current facility-administered medications for this visit.     PHYSICAL EXAMINATION: ECOG PERFORMANCE STATUS: 0 - Asymptomatic Vitals:   10/26/19 1459  BP: (!) 148/94  Pulse: 68  Resp: 18  Temp: 98.4 F (36.9 C)   Filed Weights   10/26/19 1459  Weight: 160 lb 3.2 oz (72.7 kg)    Physical Exam Constitutional:      General: She is not in acute distress. HENT:     Head: Normocephalic and atraumatic.  Eyes:     General: No scleral icterus. Cardiovascular:     Rate and Rhythm: Normal rate and regular rhythm.     Heart sounds: Normal heart sounds.  Pulmonary:     Effort: Pulmonary effort is normal. No respiratory distress.     Breath sounds: No wheezing.  Abdominal:     General: Bowel sounds are normal. There is no distension.     Palpations: Abdomen is soft.  Musculoskeletal:        General: No deformity. Normal range of motion.     Cervical back: Normal range of motion and neck supple.  Skin:    General: Skin is warm and dry.     Findings: No erythema or rash.  Neurological:     Mental Status: She is alert and oriented to person, place, and time. Mental status is at baseline.     Cranial Nerves: No cranial nerve deficit.     Coordination: Coordination normal.  Psychiatric:        Mood and Affect: Mood normal.     RADIOGRAPHIC STUDIES: I have personally reviewed the  radiological images as listed and agreed with the findings in the report.  CMP Latest Ref Rng & Units 10/26/2019  Glucose 70 - 99 mg/dL 94  BUN 8 - 23 mg/dL 22  Creatinine 1.610.44 - 0.961.00 mg/dL 0.450.83  Sodium 409135 - 811145 mmol/L 137  Potassium 3.5 - 5.1 mmol/L 3.8  Chloride 98 - 111 mmol/L 101  CO2 22 - 32 mmol/L 28  Calcium 8.9 - 10.3 mg/dL 9.1  Total Protein 6.5 - 8.1 g/dL 7.5  Total Bilirubin 0.3 - 1.2 mg/dL 0.7  Alkaline Phos 38 - 126 U/L 60  AST 15 - 41 U/L 23  ALT 0 - 44 U/L 26   CBC Latest Ref Rng & Units 10/26/2019  WBC 4.0 - 10.5 K/uL 2.9(L)  Hemoglobin 12.0 - 15.0 g/dL 50.3  Hematocrit 36 - 46 % 38.4  Platelets 150 - 400 K/uL 217    LABORATORY DATA:  I have reviewed the data as listed Lab Results  Component Value Date   WBC 2.9 (L) 10/26/2019   HGB 12.9 10/26/2019   HCT 38.4 10/26/2019   MCV 94.1 10/26/2019   PLT 217 10/26/2019   Recent Labs    07/15/19 1121 10/26/19 1534  NA 142 137  K 4.0 3.8  CL 105 101  CO2 25 28  GLUCOSE 91 94  BUN 22 22  CREATININE 0.76 0.83  CALCIUM 9.7 9.1  GFRNONAA 85 >60  GFRAA 98  --   PROT 7.2 7.5  ALBUMIN 4.2 4.2  AST 21 23  ALT 27 26  ALKPHOS 77 60  BILITOT 0.5 0.7   Iron/TIBC/Ferritin/ %Sat No results found for: IRON, TIBC, FERRITIN, IRONPCTSAT   RADIOGRAPHIC STUDIES: I have personally reviewed the radiological images as listed and agreed with the findings in the report. No results found.    ASSESSMENT & PLAN:  1. Leukopenia, unspecified type     I discussed with patient that the differential diagnosis of the leukopenia is broad, including infection, inflammation, nutrition deficiency, medication side effects, ethnic, malignant etiology including underlying bone morrow disorders.  For the work up of patient's thrombocytopenia, I recommend checking CBC;CMP, LDH; smear review, folate, Vitamin B12, hepatitis, HIV, ANA,  flowcytometry  # Patient follow-up with me in approximately 2 weeks to review the above results.    Orders Placed This Encounter  Procedures  . CBC with Differential/Platelet    Standing Status:   Future    Number of Occurrences:   1    Standing Expiration Date:   10/25/2020  . Comprehensive metabolic panel    Standing Status:   Future    Number of Occurrences:   1    Standing Expiration Date:   10/25/2020  . Vitamin B12    Standing Status:   Future    Number of Occurrences:   1    Standing Expiration Date:   10/25/2020  . Folate    Standing Status:   Future    Number of Occurrences:   1    Standing Expiration Date:   10/25/2020  . Technologist smear review    Standing Status:   Future    Number of Occurrences:   1    Standing Expiration Date:   10/25/2020  . HIV Antibody (routine testing w rflx)    Standing Status:   Future    Number of Occurrences:   1    Standing Expiration Date:   10/25/2020  . ANA, IFA (with reflex)    Standing Status:   Future    Number of Occurrences:   1    Standing Expiration Date:   10/25/2020  . Flow cytometry panel-leukemia/lymphoma work-up    Standing Status:   Future    Number of Occurrences:   1    Standing Expiration Date:   10/25/2020  . Lactate dehydrogenase    Standing Status:   Future    Number of Occurrences:   1    Standing Expiration Date:   10/25/2020  . Hepatitis B surface antigen    Standing Status:   Future    Number of Occurrences:   1    Standing Expiration Date:   10/25/2020  . Hepatitis B core antibody, total    Standing Status:   Future    Number  of Occurrences:   1    Standing Expiration Date:   10/25/2020    All questions were answered. The patient knows to call the clinic with any problems questions or concerns.  Cc Carlean Jews, NP  Return of visit: 2 weeks Thank you for this kind referral and the opportunity to participate in the care of this patient. A copy of today's note is routed to referring provider      Rickard Patience, MD, PhD Hematology Oncology [facility] Pager- [phone] 10/26/2019

## 2019-10-27 LAB — HEPATITIS B CORE ANTIBODY, TOTAL: Hep B Core Total Ab: REACTIVE — AB

## 2019-10-27 LAB — ANTINUCLEAR ANTIBODIES, IFA: ANA Ab, IFA: NEGATIVE

## 2019-10-28 LAB — COMP PANEL: LEUKEMIA/LYMPHOMA

## 2019-11-04 ENCOUNTER — Telehealth: Payer: Self-pay | Admitting: *Deleted

## 2019-11-04 NOTE — Telephone Encounter (Signed)
I will discuss with him at his next visit.

## 2019-11-04 NOTE — Telephone Encounter (Signed)
Patient called asking for results, She has a follow up appointment 11/09/19  CBC with Differential/Platelet (Order [id number]) Contains abnormal dataCBC with Differential/Platelet Order: [id number] Status:  Final result Visible to patient:  No (inaccessible in [place]) Next appt:  11/09/2019 at 10:15 AM in Oncology Earlie Server, MD) Dx:  Leukopenia, unspecified type  0 Result Notes  Ref Range & Units 9 d ago 1 mo ago  WBC 4.0 - 10.5 K/uL 2.9Low  2.7Low R   RBC 3.87 - 5.11 MIL/uL 4.08  4.21 R   Hemoglobin 12.0 - 15.0 g/dL 12.9  13.1 R   HCT 36 - 46 % 38.4  39.6 R   MCV 80.0 - 100.0 fL 94.1  94 R   MCH 26.0 - 34.0 pg 31.6  31.1 R   MCHC 30.0 - 36.0 g/dL 33.6  33.1 R   RDW 11.5 - 15.5 % 11.7  12.1 R   Platelets 150 - 400 K/uL 217  233 R   nRBC 0.0 - 0.2 % 0.0    Neutrophils Relative % % 39  25 R   Neutro Abs 1.7 - 7.7 K/uL 1.2Low  0.7Low R   Lymphocytes Relative % 50    Lymphs Abs 0.7 - 4.0 K/uL 1.5  1.6 R   Monocytes Relative % 7    Monocytes Absolute 0.1 - 1.0 K/uL 0.2    Eosinophils Relative % 3    Eosinophils Absolute 0.0 - 0.5 K/uL 0.1    Basophils Relative % 1    Basophils Absolute 0.0 - 0.1 K/uL 0.0  0.0 R   Immature Granulocytes % 0  0 R   Abs Immature Granulocytes 0.00 - 0.07 K/uL 0.01    Comment: Performed at [facility], [address] [place], [place] [address]  Lymphs   61 R   Monocytes   9 R   Eos   4 R   Basos   1 R   Monocytes Absolute   0.2 R   EOS (ABSOLUTE)   0.1 R   Immature Grans (Abs)   0.0 R   Resulting Agency  [facility]      Specimen Collected: 10/26/19 15:34 Last Resulted: 10/26/19 16:09     Lab Flowsheet   Order Details   View Encounter   Lab and Collection Details   Routing   Result History     R=Reference range differs from displayed range    Result Care Coordination  Patient Communication  Add Comments Not seen Back to Top      Other Results from 10/26/2019  Contains abnormal dataHepatitis B  core antibody, total  Status:  Final result Visible to patient:  No (inaccessible in MyChart) Next appt:  11/09/2019 at 10:15 AM in Oncology Earlie Server, MD) Dx:  Leukopenia, unspecified type Order: [id number]  0 Result Notes  Ref Range & Units 9 d ago  Hep B Core Total Ab NON REACTIVE ReactiveAbnormal   Comment: Performed at [facility], [address] [place],  [address]  Resulting Agency  [facility]      Specimen Collected: 10/26/19 15:34 Last Resulted: 10/27/19 05:29     Lab Flowsheet   Order Details   View Encounter   Lab and Collection Details   Routing   Result History       Result Care Coordination  Patient Communication  Add Comments Not seen Back to Top        Hepatitis B surface antigen  Status:  Final result Visible to patient:  No (inaccessible in MyChart) Next appt:  11/09/2019 at 10:15 AM in Oncology Earlie Server, MD) Dx:  Leukopenia, unspecified type Order: [id number]  0 Result Notes  Ref Range & Units 9 d ago  Hepatitis B Surface Ag NON REACTIVE NON REACTIVE   Comment: Performed at [facility], [place] [address] [place], [place] [address]  Resulting Agency  [facility]      Specimen Collected: 10/26/19 15:34 Last Resulted: 10/26/19 20:20     Lab Flowsheet   Order Details   View Encounter   Lab and Collection Details   Routing   Result History       Result Care Coordination  Patient Communication  Add Comments Not seen Back to Top        Lactate dehydrogenase  Status:  Final result Visible to patient:  No (inaccessible in MyChart) Next appt:  11/09/2019 at 10:15 AM in Oncology Earlie Server, MD) Dx:  Leukopenia, unspecified type Order: [id number]  0 Result Notes  Ref Range & Units 9 d ago  LDH 98 - 192 U/L 151   Comment: Performed at [facility], [address] [place], [place] [address]  Resulting Agency  [facility]      Specimen Collected: 10/26/19 15:34 Last Resulted: 10/26/19 16:01      Lab Flowsheet   Order Details   View Encounter   Lab and Collection Details   Routing   Result History       Result Care Coordination  Patient Communication  Add Comments Not seen Back to Top        Flow cytometry panel-leukemia/lymphoma work-up  Status:  Edited Result - FINAL Visible to patient:  No (inaccessible in MyChart) Next appt:  11/09/2019 at 10:15 AM in Oncology Earlie Server, MD) Dx:  Leukopenia, unspecified type Order: [id number]  0 Result Notes Component 9 d ago  PATH INTERP XXX-IMP Comment   Comment: No significant immunophenotypic abnormality detected  CLINICAL INFO Comment VC   Comment: (NOTE)  Accompanying CBC dated 10-26-19 shows:  WBC count 2.9, Neu 1.2, Lym 1.5, Mon 0.2.   Specimen Type Comment   Comment: Peripheral blood  ASSESSMENT OF LEUKOCYTES Comment   Comment: (NOTE)  No monoclonal B cell population is detected. kappa:lambda ratio 1.6  There is no loss of, or aberrant expression of, the pan T cell  antigens to  suggest a neoplastic T cell process.  CD4:CD8 ratio 1.7  No circulating blasts are detected.  There is no immunophenotypic evidence of abnormal myeloid maturation.  Analysis of the leukocyte population shows: granulocytes 47%,  monocytes 5%,  lymphocytes 48%, blasts <0.1%, B cells 5%, T cells 41%, NK cells 2%.   % Viable Cells Comment VC   Comment: 96%  ANALYSIS AND GATING STRATEGY Comment   Comment: 8 color analysis with CD45/SSC  IMMUNOPHENOTYPING STUDY Comment   Comment: (NOTE)  CD2    Normal     CD3    Normal  CD4    Normal     CD5    Normal  CD7    Normal     CD8    Normal  CD10   Normal     CD11b   Normal  CD13   Normal     CD14   Normal  CD16   Normal     CD19   Normal  CD20   Normal     CD33   Normal  CD34  Normal     CD38   Normal  CD45   Normal     CD56   Normal  CD57   Normal     CD117   Normal  HLA-DR   Normal     KAPPA   Normal  LAMBDA  Normal     CD64   Normal   PATHOLOGIST NAME Comment   Comment: Henrietta [place], M.D.  COMMENT: Comment VC   Comment: (NOTE)  Each antibody in this assay was utilized to assess for potential  abnormalities of studied cell populations or to characterize  identified abnormalities.  This test was developed and its performance characteristics  determined by [facility]. It has not been cleared or approved by the  U.S. Food and Drug Administration.  The FDA has determined that such clearance or approval is not  necessary. This test is used for clinical purposes. It should not  be regarded as investigational or for research.  Performed At: -[facility]  [place], [place] [id number]  Katina Degree MDPhD [id number]  Performed At: [facility]  [address] [place], [place] [id number]  Katina Degree MDPhD [id number]   Resulting Agency [facility]      Specimen Collected: 10/26/19 15:34 Last Resulted: 10/28/19 14:36     Lab Flowsheet   Order Details   View Encounter   Lab and Collection Details   Routing   Result History     VC=Value has a corrected status     Result Care Coordination  Patient Communication  Add Comments Not seen Back to Top        Folate  Status:  Final result Visible to patient:  No (inaccessible in [place]) Next appt:  11/09/2019 at 10:15 AM in Oncology Earlie Server, MD) Dx:  Leukopenia, unspecified type Order: [id number]  0 Result Notes  Ref Range & Units 9 d ago  Folate >5.9 ng/mL 26.0   Comment: Performed at [facility], [address] [place], [place] [address]  Resulting Agency  [facility]      Specimen Collected: 10/26/19 15:34 Last Resulted: 10/26/19 17:18     Lab Flowsheet   Order Details   View Encounter   Lab and Collection Details   Routing   Result History       Result Care Coordination  Patient Communication  Add Comments Not  seen Back to Top        ANA, IFA (with reflex)  Status:  Final result Visible to patient:  No (inaccessible in [place]) Next appt:  11/09/2019 at 10:15 AM in Oncology Earlie Server, MD) Dx:  Leukopenia, unspecified type Order: [id number]  0 Result Notes Component 9 d ago  ANA Ab, IFA Negative   Comment: (NOTE)                    Negative  <1:80                    Borderline 1:80                    Positive  >1:80  ICAP nomenclature: AC-0  For more information about Hep-2 cell patterns use  ANApatterns.org, the official website for the International  Consensus on Antinuclear Antibody (ANA) Patterns (ICAP).  Performed At: [facility]  [address] [place], [place] [id number]  Rush Farmer MD SJ:[id number]   Resulting Agency [facility] [place]  Specimen Collected: 10/26/19 15:34 Last Resulted: 10/27/19 12:36     Lab Flowsheet   Order Details   View Encounter   Lab and Collection Details   Routing   Result History       Result Care Coordination  Patient Communication  Add Comments Not seen Back to Top        HIV Antibody (routine testing w rflx)  Status:  Final result Visible to patient:  No (inaccessible in MyChart) Next appt:  11/09/2019 at 10:15 AM in Oncology Earlie Server, MD) Dx:  Leukopenia, unspecified type Order: [id number]  0 Result Notes   1 HM Topic  Ref Range & Units 9 d ago  HIV Screen 4th Generation wRfx Non Reactive Non Reactive   Comment: Performed at [facility], [place] [address], [place], [place] [address]  Resulting Agency  [facility]      Specimen Collected: 10/26/19 15:34 Last Resulted: 10/26/19 20:50     Lab Flowsheet   Order Details   View Encounter   Lab and Collection Details   Routing   Result History       Result Care Coordination  Patient Communication  Add Comments Not seen Back to Top   Satisfied Health Maintenance Topics   Back to  Top HIV Screening   Completed  Address Topic        Vitamin B12  Status:  Final result Visible to patient:  No (inaccessible in MyChart) Next appt:  11/09/2019 at 10:15 AM in Oncology Earlie Server, MD) Dx:  Leukopenia, unspecified type Order: [id number]  0 Result Notes  Ref Range & Units 9 d ago  Vitamin B-12 180 - 914 pg/mL 250   Comment: (NOTE)  This assay is not validated for testing neonatal or  myeloproliferative syndrome specimens for Vitamin B12 levels.  Performed at [facility], [place] [address] [place], [place]  [address]   Resulting Agency  [facility]      Specimen Collected: 10/26/19 15:34 Last Resulted: 10/26/19 20:17     Lab Flowsheet   Order Details   View Encounter   Lab and Collection Details   Routing   Result History       Result Care Coordination  Patient Communication  Add Comments Not seen Back to Top        Comprehensive metabolic panel  Status:  Final result Visible to patient:  No (inaccessible in MyChart) Next appt:  11/09/2019 at 10:15 AM in Oncology Earlie Server, MD) Dx:  Leukopenia, unspecified type Order: [id number]  0 Result Notes  Ref Range & Units 9 d ago 3 mo ago  Sodium 135 - 145 mmol/L 137  142 R   Potassium 3.5 - 5.1 mmol/L 3.8  4.0 R   Chloride 98 - 111 mmol/L 101  105 R   CO2 22 - 32 mmol/L 28  25 R   Glucose, Bld 70 - 99 mg/dL 94  91 R   Comment: Glucose reference range applies only to samples taken after fasting for at least 8 hours.  BUN 8 - 23 mg/dL 22  22 R   Creatinine, Ser 0.44 - 1.00 mg/dL 0.83  0.76 R   Calcium 8.9 - 10.3 mg/dL 9.1  9.7 R   Total Protein 6.5 - 8.1 g/dL 7.5  7.2 R   Albumin 3.5 - 5.0 g/dL 4.2  4.2 R   AST 15 - 41 U/L 23  21 R   ALT 0 - 44 U/L  26  27 R   Alkaline Phosphatase 38 - 126 U/L 60  77 R   Total Bilirubin 0.3 - 1.2 mg/dL 0.7  0.5 R   GFR, Estimated >60 mL/min >60    Anion gap 5 - 15 8    Comment: Performed at [facility], [address] [place], [place] [address]   Resulting Agency  [facility]      Specimen Collected: 10/26/19 15:34 Last Resulted: 10/26/19 16:01     Lab Flowsheet   Order Details   View Encounter   Lab and Collection Details   Routing   Result History     R=Reference range differs from displayed range    Result Care Coordination  Patient Communication  Add Comments Not seen Back to Top        Technologist smear review  Status:  Final result Visible to patient:  No (inaccessible in [place]) Next appt:  11/09/2019 at 10:15 AM in Oncology Earlie Server, MD) Dx:  Leukopenia, unspecified type Order: [id number]  0 Result Notes Component 9 d ago  WBC Morphology MORPHOLOGY UNREMARKABLE   RBC Morphology MORPHOLOGY UNREMARKABLE   Tech Review MORPHOLOGY UNREMARKABLE   Comment: Performed at [facility], [address], [place],  [address]  Resulting Agency [facility]      Specimen Collected: 10/26/19 15:32 Last Resulted: 10/26/19 17:27

## 2019-11-05 NOTE — Telephone Encounter (Signed)
Patient informed that doctor will discuss at her appointment Tuesday

## 2019-11-09 ENCOUNTER — Inpatient Hospital Stay: Payer: BLUE CROSS/BLUE SHIELD

## 2019-11-09 ENCOUNTER — Inpatient Hospital Stay: Payer: BLUE CROSS/BLUE SHIELD | Attending: Oncology | Admitting: Oncology

## 2019-11-09 ENCOUNTER — Encounter: Payer: Self-pay | Admitting: Oncology

## 2019-11-09 ENCOUNTER — Other Ambulatory Visit: Payer: Self-pay

## 2019-11-09 VITALS — BP 148/93 | HR 63 | Temp 97.9°F | Resp 16 | Wt 161.4 lb

## 2019-11-09 DIAGNOSIS — D72819 Decreased white blood cell count, unspecified: Secondary | ICD-10-CM

## 2019-11-09 DIAGNOSIS — I10 Essential (primary) hypertension: Secondary | ICD-10-CM | POA: Diagnosis not present

## 2019-11-09 DIAGNOSIS — Z8619 Personal history of other infectious and parasitic diseases: Secondary | ICD-10-CM | POA: Insufficient documentation

## 2019-11-09 DIAGNOSIS — Z801 Family history of malignant neoplasm of trachea, bronchus and lung: Secondary | ICD-10-CM | POA: Diagnosis not present

## 2019-11-09 DIAGNOSIS — Z79899 Other long term (current) drug therapy: Secondary | ICD-10-CM | POA: Insufficient documentation

## 2019-11-09 DIAGNOSIS — R768 Other specified abnormal immunological findings in serum: Secondary | ICD-10-CM

## 2019-11-09 DIAGNOSIS — Z90710 Acquired absence of both cervix and uterus: Secondary | ICD-10-CM | POA: Insufficient documentation

## 2019-11-09 LAB — HEPATITIS B CORE ANTIBODY, IGM: Hep B C IgM: NONREACTIVE

## 2019-11-09 MED ORDER — VITAMIN B-12 1000 MCG PO TABS: 1000.0000 ug | ORAL_TABLET | Freq: Every day | ORAL | 0 refills | Status: DC

## 2019-11-09 NOTE — Progress Notes (Signed)
Patient denies new problems/concerns today.   °

## 2019-11-09 NOTE — Progress Notes (Signed)
Hematology/Oncology Consult note [facility] Telephone:([phone] Fax:([phone]) [phone]   Patient Care Team: Ronnell Freshwater, NP as PCP - General (Family Medicine)  REFERRING PROVIDER: Ronnell Freshwater, NP  CHIEF COMPLAINTS/REASON FOR VISIT:  Follow-up for leukopenia  HISTORY OF PRESENTING ILLNESS:  Donna Donaldson is a 59 y.o. female who was seen in consultation at the request of Ronnell Freshwater, NP for evaluation of leukopenia. Reviewed patient's recent labs.  None 08/2019 patient has low total WBC count was 2.7, predominantly neutropenia with ANC 0.7. Previous lab records reviewed. 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 No aggravating or improving factors.  Associated symptoms:  denies fatigue, weight loss, fever, chills, frequent infection.  History hepatitis or HIV infection: Denies History of chronic liver disease: Denies History of blood transfusion: Denies Alcohol consumption: Denies Diet Vegetarian or Vegan: Denies Herbal medication: Denies  Donna Donaldson is a 59 y.o. female who has above history reviewed by me today presents for follow up visit for management of leukopenia Problems and complaints are listed below: Patient has had blood work done and presents to discuss results and management plan.  No new complaints.  Review of Systems  Constitutional: Negative for appetite change, chills, fatigue and fever.  HENT:   Negative for hearing loss and voice change.   Eyes: Negative for eye problems.  Respiratory: Negative for chest tightness and cough.   Cardiovascular: Negative for chest pain.  Gastrointestinal: Negative for abdominal distention, abdominal pain and blood in stool.  Endocrine: Negative for hot flashes.  Genitourinary: Negative for difficulty urinating and frequency.   Musculoskeletal: Negative for arthralgias.  Skin: Negative for itching and rash.  Neurological: Negative for  extremity weakness.  Hematological: Negative for adenopathy.  Psychiatric/Behavioral: Negative for confusion.    MEDICAL HISTORY:  Past Medical History:  Diagnosis Date  . Hypertension     SURGICAL HISTORY: Past Surgical History:  Procedure Laterality Date  . ABDOMINAL HYSTERECTOMY      SOCIAL HISTORY: Social History   Socioeconomic History  . Marital status: Single    Spouse name: Not on file  . Number of children: Not on file  . Years of education: Not on file  . Highest education level: Not on file  Occupational History  . Not on file  Tobacco Use  . Smoking status: Never Smoker  . Smokeless tobacco: Never Used  Substance and Sexual Activity  . Alcohol use: Yes    Comment: social  . Drug use: No  . Sexual activity: Not on file  Other Topics Concern  . Not on file  Social History Narrative  . Not on file   Social Determinants of Health   Financial Resource Strain:   . Difficulty of Paying Living Expenses: Not on file  Food Insecurity:   . Worried About Charity fundraiser in the Last Year: Not on file  . Ran Out of Food in the Last Year: Not on file  Transportation Needs:   . Lack of Transportation (Medical): Not on file  . Lack of Transportation (Non-Medical): Not on file  Physical Activity:   . Days of Exercise per Week: Not on file  . Minutes of Exercise per Session: Not on file  Stress:   . Feeling of Stress : Not on file  Social Connections:   . Frequency of Communication with Friends and Family: Not on file  . Frequency of Social Gatherings with Friends and Family: Not on file  . Attends  Religious Services: Not on file  . Active Member of Clubs or Organizations: Not on file  . Attends Archivist Meetings: Not on file  . Marital Status: Not on file  Intimate Partner Violence:   . Fear of Current or Ex-Partner: Not on file  . Emotionally Abused: Not on file  . Physically Abused: Not on file  . Sexually Abused: Not on file    FAMILY  HISTORY: Family History  Problem Relation Age of Onset  . Stroke Mother   . Hypertension Mother   . Diabetes Mother   . Diabetes Maternal Aunt   . Lung cancer Maternal Aunt   . Lung disease Maternal Aunt   . Bone cancer Maternal Grandfather   . Breast cancer Neg Hx     ALLERGIES:  has No Known Allergies.  MEDICATIONS:  Current Outpatient Medications  Medication Sig Dispense Refill  . hydrochlorothiazide (HYDRODIURIL) 25 MG tablet Take 1 tablet (25 mg total) by mouth daily. 30 tablet 5  . lisinopril (ZESTRIL) 20 MG tablet TAKE 1 TABLET(20 MG) BY MOUTH DAILY 30 tablet 5  . Vitamin D, Ergocalciferol, (DRISDOL) 1.25 MG (50000 UNIT) CAPS capsule Take 1 capsule (50,000 Units total) by mouth every 7 (seven) days. 5 capsule 5  . ciprofloxacin (CIPRO) 500 MG tablet Take 1 tablet (500 mg total) by mouth 2 (two) times daily. (Patient not taking: Reported on 10/26/2019) 20 tablet 0   No current facility-administered medications for this visit.     PHYSICAL EXAMINATION: ECOG PERFORMANCE STATUS: 0 - Asymptomatic Vitals:   11/09/19 1033  BP: (!) 148/93  Pulse: 63  Resp: 16  Temp: 97.9 F (36.6 C)   Filed Weights   11/09/19 1033  Weight: 161 lb 6.4 oz (73.2 kg)    Physical Exam Constitutional:      General: She is not in acute distress. HENT:     Head: Normocephalic and atraumatic.  Eyes:     General: No scleral icterus. Cardiovascular:     Rate and Rhythm: Normal rate and regular rhythm.     Heart sounds: Normal heart sounds.  Pulmonary:     Effort: Pulmonary effort is normal. No respiratory distress.     Breath sounds: No wheezing.  Abdominal:     General: Bowel sounds are normal. There is no distension.     Palpations: Abdomen is soft.  Musculoskeletal:        General: No deformity. Normal range of motion.     Cervical back: Normal range of motion and neck supple.  Skin:    General: Skin is warm and dry.     Findings: No erythema or rash.  Neurological:     Mental  Status: She is alert and oriented to person, place, and time. Mental status is at baseline.     Cranial Nerves: No cranial nerve deficit.     Coordination: Coordination normal.  Psychiatric:        Mood and Affect: Mood normal.     RADIOGRAPHIC STUDIES: I have personally reviewed the radiological images as listed and agreed with the findings in the report.  CMP Latest Ref Rng & Units 10/26/2019  Glucose 70 - 99 mg/dL 94  BUN 8 - 23 mg/dL 22  Creatinine 0.44 - 1.00 mg/dL 0.83  Sodium 135 - 145 mmol/L 137  Potassium 3.5 - 5.1 mmol/L 3.8  Chloride 98 - 111 mmol/L 101  CO2 22 - 32 mmol/L 28  Calcium 8.9 - 10.3 mg/dL 9.1  Total Protein 6.5 -  8.1 g/dL 7.5  Total Bilirubin 0.3 - 1.2 mg/dL 0.7  Alkaline Phos 38 - 126 U/L 60  AST 15 - 41 U/L 23  ALT 0 - 44 U/L 26   CBC Latest Ref Rng & Units 10/26/2019  WBC 4.0 - 10.5 K/uL 2.9(L)  Hemoglobin 12.0 - 15.0 g/dL 12.9  Hematocrit 36 - 46 % 38.4  Platelets 150 - 400 K/uL 217    LABORATORY DATA:  I have reviewed the data as listed Lab Results  Component Value Date   WBC 2.9 (L) 10/26/2019   HGB 12.9 10/26/2019   HCT 38.4 10/26/2019   MCV 94.1 10/26/2019   PLT 217 10/26/2019   Recent Labs    07/15/19 1121 10/26/19 1534  NA 142 137  K 4.0 3.8  CL 105 101  CO2 25 28  GLUCOSE 91 94  BUN 22 22  CREATININE 0.76 0.83  CALCIUM 9.7 9.1  GFRNONAA 85 >60  GFRAA 98  --   PROT 7.2 7.5  ALBUMIN 4.2 4.2  AST 21 23  ALT 27 26  ALKPHOS 77 60  BILITOT 0.5 0.7   Iron/TIBC/Ferritin/ %Sat No results found for: IRON, TIBC, FERRITIN, IRONPCTSAT   RADIOGRAPHIC STUDIES: I have personally reviewed the radiological images as listed and agreed with the findings in the report. No results found.    ASSESSMENT & PLAN:  1. Leukopenia, unspecified type   2. Hepatitis B core antibody positive    Leukopenia, predominantly neutropenia. Labs are reviewed and discussed with patient. ANC 1.3. Work-up showed negative flow cytometry, normal LDH,  smear is negative, negative HIV, negative ANA. Vitamin B12 levels at low normal and I recommend patient to take empiric vitamin B12 1000 MCG daily. I will repeat B12 the next visit.  Positive hepatitis B core antibody.  I will check hepatitis B core antibody IgM- negative.  I will hold off additional work-up for the leukopenia at this point given that [place] has recovered to close to baseline. 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  I will hold off bone marrow biopsy at this point if her blood counts are stable and she is asymptomatic.  Patient agrees with the plan Patient will follow up in 3 months.  Orders Placed This Encounter  Procedures  . Hepatitis B core antibody, IgM    Standing Status:   Future    Number of Occurrences:   1    Standing Expiration Date:   11/08/2020    All questions were answered. The patient knows to call the clinic with any problems questions or concerns.  Cc Ronnell Freshwater, NP  Return of visit: 3 months      Earlie Server, MD, PhD Hematology Oncology [facility] at [facility] Pager- [phone] 11/09/2019

## 2019-11-11 ENCOUNTER — Telehealth: Payer: Self-pay

## 2019-11-11 NOTE — Telephone Encounter (Signed)
Pt.notified

## 2019-11-11 NOTE — Telephone Encounter (Signed)
-----   Message from Rickard Patience, MD sent at 11/11/2019  8:37 AM EDT ----- Please let her know that additional testing of Hepatitis B is negative.

## 2019-12-10 ENCOUNTER — Other Ambulatory Visit: Payer: Self-pay

## 2019-12-10 DIAGNOSIS — I10 Essential (primary) hypertension: Secondary | ICD-10-CM

## 2019-12-10 MED ORDER — HYDROCHLOROTHIAZIDE 25 MG PO TABS: 25.0000 mg | ORAL_TABLET | Freq: Every day | ORAL | 2 refills | Status: DC

## 2020-01-25 ENCOUNTER — Other Ambulatory Visit: Payer: Self-pay

## 2020-01-25 DIAGNOSIS — I10 Essential (primary) hypertension: Secondary | ICD-10-CM

## 2020-01-25 MED ORDER — LISINOPRIL 20 MG PO TABS: ORAL_TABLET | ORAL | 5 refills | Status: DC

## 2020-01-31 ENCOUNTER — Ambulatory Visit: Payer: BLUE CROSS/BLUE SHIELD | Admitting: Physician Assistant

## 2020-01-31 VITALS — Ht 68.0 in | Wt 155.0 lb

## 2020-01-31 DIAGNOSIS — U071 COVID-19: Secondary | ICD-10-CM | POA: Diagnosis not present

## 2020-01-31 NOTE — Progress Notes (Signed)
[facility] [address] [place], [place] [address]  Internal MEDICINE  Telephone Visit  Patient Name: Donna Donaldson  [id number]  [id number]  Date of Service: 02/09/2020  I connected with the patient at 3:34 by telephone and verified the patients identity using two identifiers.   I discussed the limitations, risks, security and privacy concerns of performing an evaluation and management service by telephone and the availability of in person appointments. I also discussed with the patient that there may be a patient responsible charge related to the service.  The patient expressed understanding and agrees to proceed.    Chief Complaint  Patient presents with  . Telephone Assessment    Telephone-[phone]  . Telephone Screen    Covid positive last Friday and symptoms start tuesday  . Sinusitis    HPI Pt reports today for a sick visit. She tested positive for covid last Friday and her symptoms started Tuesday. She complains mainly of congestion currently and is not coughing much now. Feels she is improving. Vaccination up to date with booster. She used OTC meds: Coricidin and ibuprofen as needed, but has not needed them in several days. She is breathing ok and not wheezing.  Current Medication: Outpatient Encounter Medications as of 01/31/2020  Medication Sig  . ciprofloxacin (CIPRO) 500 MG tablet Take 1 tablet (500 mg total) by mouth 2 (two) times daily.  . hydrochlorothiazide (HYDRODIURIL) 25 MG tablet Take 1 tablet (25 mg total) by mouth daily.  Marland Kitchen lisinopril (ZESTRIL) 20 MG tablet TAKE 1 TABLET(20 MG) BY MOUTH DAILY  . vitamin B-12 (CYANOCOBALAMIN) 1000 MCG tablet Take 1 tablet (1,000 mcg total) by mouth daily.  . Vitamin D, Ergocalciferol, (DRISDOL) 1.25 MG (50000 UNIT) CAPS capsule Take 1 capsule (50,000 Units total) by mouth every 7 (seven) days.   No facility-administered encounter medications on file as of 01/31/2020.    Surgical History: Past Surgical History:   Procedure Laterality Date  . ABDOMINAL HYSTERECTOMY      Medical History: Past Medical History:  Diagnosis Date  . Hypertension     Family History: Family History  Problem Relation Age of Onset  . Stroke Mother   . Hypertension Mother   . Diabetes Mother   . Diabetes Maternal Aunt   . Lung cancer Maternal Aunt   . Lung disease Maternal Aunt   . Bone cancer Maternal Grandfather   . Breast cancer Neg Hx     Social History   Socioeconomic History  . Marital status: Single    Spouse name: Not on file  . Number of children: Not on file  . Years of education: Not on file  . Highest education level: Not on file  Occupational History  . Not on file  Tobacco Use  . Smoking status: Never Smoker  . Smokeless tobacco: Never Used  Substance and Sexual Activity  . Alcohol use: Yes    Comment: social  . Drug use: No  . Sexual activity: Not on file  Other Topics Concern  . Not on file  Social History Narrative  . Not on file   Social Determinants of Health   Financial Resource Strain: Not on file  Food Insecurity: Not on file  Transportation Needs: Not on file  Physical Activity: Not on file  Stress: Not on file  Social Connections: Not on file  Intimate Partner Violence: Not on file      Review of Systems  Constitutional: Negative for fatigue and fever.  HENT: Positive for congestion. Negative  for mouth sores and postnasal drip.   Respiratory: Positive for cough. Negative for shortness of breath and wheezing.   Cardiovascular: Negative for chest pain.  Genitourinary: Negative for flank pain.  Neurological: Negative for headaches.  Psychiatric/Behavioral: Negative.     Vital Signs: Ht 5\' 8"  (1.727 m)   Wt 155 lb (70.3 kg)   BMI 23.57 kg/m    Observation/Objective:  Pt is able to carry out conversation.   Assessment/Plan: 1. Telehealth encounter for confirmed COVID-19 Pt is doing better and is having some lingering congestion. She will try taking  mucinex and OTC nasal spray such as flonase to help this resolve. She will f/u with to let us know if she is not improving.  General Counseling: Faatima verbalizes understanding of the findings of today's phone visit and agrees with plan of treatment. I have discussed any further diagnostic evaluation that may be needed or ordered today. We also reviewed her medications today. she has been encouraged to call the office with any questions or concerns that should arise related to todays visit.   Time spent:15 Minutes    Dr [place] Internal medicine

## 2020-02-01 ENCOUNTER — Ambulatory Visit: Payer: BLUE CROSS/BLUE SHIELD | Admitting: Internal Medicine

## 2020-02-08 ENCOUNTER — Inpatient Hospital Stay: Payer: BLUE CROSS/BLUE SHIELD

## 2020-02-09 ENCOUNTER — Inpatient Hospital Stay: Payer: BLUE CROSS/BLUE SHIELD | Admitting: Oncology

## 2020-02-22 ENCOUNTER — Inpatient Hospital Stay: Payer: BLUE CROSS/BLUE SHIELD | Attending: Oncology

## 2020-02-22 DIAGNOSIS — I10 Essential (primary) hypertension: Secondary | ICD-10-CM | POA: Diagnosis not present

## 2020-02-22 DIAGNOSIS — Z79899 Other long term (current) drug therapy: Secondary | ICD-10-CM | POA: Diagnosis not present

## 2020-02-22 DIAGNOSIS — Z801 Family history of malignant neoplasm of trachea, bronchus and lung: Secondary | ICD-10-CM | POA: Insufficient documentation

## 2020-02-22 DIAGNOSIS — D72819 Decreased white blood cell count, unspecified: Secondary | ICD-10-CM | POA: Insufficient documentation

## 2020-02-22 DIAGNOSIS — Z800 Family history of malignant neoplasm of digestive organs: Secondary | ICD-10-CM | POA: Insufficient documentation

## 2020-02-22 DIAGNOSIS — Z803 Family history of malignant neoplasm of breast: Secondary | ICD-10-CM | POA: Insufficient documentation

## 2020-02-22 DIAGNOSIS — R768 Other specified abnormal immunological findings in serum: Secondary | ICD-10-CM

## 2020-02-22 LAB — CBC WITH DIFFERENTIAL/PLATELET
Abs Immature Granulocytes: 0.01 10*3/uL (ref 0.00–0.07)
Basophils Absolute: 0 10*3/uL (ref 0.0–0.1)
Basophils Relative: 1 %
Eosinophils Absolute: 0.1 10*3/uL (ref 0.0–0.5)
Eosinophils Relative: 2 %
HCT: 37 % (ref 36.0–46.0)
Hemoglobin: 12.6 g/dL (ref 12.0–15.0)
Immature Granulocytes: 0 %
Lymphocytes Relative: 57 %
Lymphs Abs: 2 10*3/uL (ref 0.7–4.0)
MCH: 31.7 pg (ref 26.0–34.0)
MCHC: 34.1 g/dL (ref 30.0–36.0)
MCV: 93.2 fL (ref 80.0–100.0)
Monocytes Absolute: 0.4 10*3/uL (ref 0.1–1.0)
Monocytes Relative: 11 %
Neutro Abs: 1 10*3/uL — ABNORMAL LOW (ref 1.7–7.7)
Neutrophils Relative %: 29 %
Platelets: 268 10*3/uL (ref 150–400)
RBC: 3.97 MIL/uL (ref 3.87–5.11)
RDW: 12 % (ref 11.5–15.5)
WBC: 3.5 10*3/uL — ABNORMAL LOW (ref 4.0–10.5)
nRBC: 0 % (ref 0.0–0.2)

## 2020-02-22 LAB — COMPREHENSIVE METABOLIC PANEL
ALT: 34 U/L (ref 0–44)
AST: 24 U/L (ref 15–41)
Albumin: 4 g/dL (ref 3.5–5.0)
Alkaline Phosphatase: 62 U/L (ref 38–126)
Anion gap: 8 (ref 5–15)
BUN: 18 mg/dL (ref 8–23)
CO2: 30 mmol/L (ref 22–32)
Calcium: 9.5 mg/dL (ref 8.9–10.3)
Chloride: 101 mmol/L (ref 98–111)
Creatinine, Ser: 0.63 mg/dL (ref 0.44–1.00)
GFR, Estimated: >60 mL/min (ref >60)
Glucose, Bld: 91 mg/dL (ref 70–99)
Potassium: 3.8 mmol/L (ref 3.5–5.1)
Sodium: 139 mmol/L (ref 135–145)
Total Bilirubin: 0.6 mg/dL (ref 0.3–1.2)
Total Protein: 7.5 g/dL (ref 6.5–8.1)

## 2020-02-22 LAB — VITAMIN B12: Vitamin B-12: 259 pg/mL (ref 180–914)

## 2020-02-23 ENCOUNTER — Inpatient Hospital Stay: Payer: BLUE CROSS/BLUE SHIELD | Admitting: Oncology

## 2020-02-23 ENCOUNTER — Encounter: Payer: Self-pay | Admitting: Oncology

## 2020-02-23 VITALS — BP 158/90 | HR 65 | Temp 98.6°F | Wt 160.0 lb

## 2020-02-23 DIAGNOSIS — D72819 Decreased white blood cell count, unspecified: Secondary | ICD-10-CM | POA: Diagnosis not present

## 2020-02-23 MED ORDER — VITAMIN B-12 1000 MCG PO TABS: 1000.0000 ug | ORAL_TABLET | Freq: Every day | ORAL | 0 refills | Status: AC

## 2020-02-23 NOTE — Progress Notes (Signed)
Patient here for oncology follow-up appointment, expresses no complaints or concerns at this time.    

## 2020-02-23 NOTE — Progress Notes (Signed)
Hematology/Oncology Consult note [facility] Telephone:([phone] Fax:([phone]   Patient Care Team: Lyndon Code, MD as PCP - General (Internal Medicine)  REFERRING PROVIDER: Carlean Jews, NP  CHIEF COMPLAINTS/REASON FOR VISIT:  Follow-up for leukopenia  HISTORY OF PRESENTING ILLNESS:  Donna Donaldson is a 60 y.o. female who was seen in consultation at the request of Carlean Jews, NP for evaluation of leukopenia. Reviewed patient's recent labs.  None 08/2019 patient has low total WBC count was 2.7, predominantly neutropenia with ANC 0.7. Previous lab records reviewed. 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 No aggravating or improving factors.  Associated symptoms:  denies fatigue, weight loss, fever, chills, frequent infection.  History hepatitis or HIV infection: Denies History of chronic liver disease: Denies History of blood transfusion: Denies Alcohol consumption: Denies Diet Vegetarian or Vegan: Denies Herbal medication: Denies  INTERVAL HISTORY Donna Donaldson is a 60 y.o. female who has above history reviewed by me today presents for follow up visit for management of leukopenia Problems and complaints are listed below: Patient reports feeling well.  Not currently taking vitamin B12 supplementation.  Review of Systems  Constitutional: Negative for appetite change, chills, fatigue and fever.  HENT:   Negative for hearing loss and voice change.   Eyes: Negative for eye problems.  Respiratory: Negative for chest tightness and cough.   Cardiovascular: Negative for chest pain.  Gastrointestinal: Negative for abdominal distention, abdominal pain and blood in stool.  Endocrine: Negative for hot flashes.  Genitourinary: Negative for difficulty urinating and frequency.   Musculoskeletal: Negative for arthralgias.  Skin: Negative for itching and rash.  Neurological: Negative for extremity weakness.   Hematological: Negative for adenopathy.  Psychiatric/Behavioral: Negative for confusion.    MEDICAL HISTORY:  Past Medical History:  Diagnosis Date  . Hypertension     SURGICAL HISTORY: Past Surgical History:  Procedure Laterality Date  . ABDOMINAL HYSTERECTOMY      SOCIAL HISTORY: Social History   Socioeconomic History  . Marital status: Single    Spouse name: Not on file  . Number of children: Not on file  . Years of education: Not on file  . Highest education level: Not on file  Occupational History  . Not on file  Tobacco Use  . Smoking status: Never Smoker  . Smokeless tobacco: Never Used  Substance and Sexual Activity  . Alcohol use: Yes    Comment: social  . Drug use: No  . Sexual activity: Not on file  Other Topics Concern  . Not on file  Social History Narrative  . Not on file   Social Determinants of Health   Financial Resource Strain: Not on file  Food Insecurity: Not on file  Transportation Needs: Not on file  Physical Activity: Not on file  Stress: Not on file  Social Connections: Not on file  Intimate Partner Violence: Not on file    FAMILY HISTORY: Family History  Problem Relation Age of Onset  . Stroke Mother   . Hypertension Mother   . Diabetes Mother   . Diabetes Maternal Aunt   . Lung cancer Maternal Aunt   . Lung disease Maternal Aunt   . Bone cancer Maternal Grandfather   . Breast cancer Neg Hx     ALLERGIES:  has No Known Allergies.  MEDICATIONS:  Current Outpatient Medications  Medication Sig Dispense Refill  . hydrochlorothiazide (HYDRODIURIL) 25 MG tablet Take 1 tablet (25 mg total) by mouth daily. 30 tablet 2  .  lisinopril (ZESTRIL) 20 MG tablet TAKE 1 TABLET(20 MG) BY MOUTH DAILY 30 tablet 5  . Vitamin D, Ergocalciferol, (DRISDOL) 1.25 MG (50000 UNIT) CAPS capsule Take 1 capsule (50,000 Units total) by mouth every 7 (seven) days. 5 capsule 5  . ciprofloxacin (CIPRO) 500 MG tablet Take 1 tablet (500 mg total) by mouth  2 (two) times daily. (Patient not taking: Reported on 02/23/2020) 20 tablet 0  . vitamin B-12 (CYANOCOBALAMIN) 1000 MCG tablet Take 1 tablet (1,000 mcg total) by mouth daily. 180 tablet 0   No current facility-administered medications for this visit.     PHYSICAL EXAMINATION: ECOG PERFORMANCE STATUS: 0 - Asymptomatic Vitals:   02/23/20 1358  BP: (!) 158/90  Pulse: 65  Temp: 98.6 F (37 C)  SpO2: 100%   Filed Weights   02/23/20 1358  Weight: 160 lb (72.6 kg)    Physical Exam Constitutional:      General: She is not in acute distress. HENT:     Head: Normocephalic and atraumatic.  Eyes:     General: No scleral icterus. Cardiovascular:     Rate and Rhythm: Normal rate and regular rhythm.     Heart sounds: Normal heart sounds.  Pulmonary:     Effort: Pulmonary effort is normal. No respiratory distress.     Breath sounds: No wheezing.  Abdominal:     General: Bowel sounds are normal. There is no distension.     Palpations: Abdomen is soft.  Musculoskeletal:        General: No deformity. Normal range of motion.     Cervical back: Normal range of motion and neck supple.  Skin:    General: Skin is warm and dry.     Findings: No erythema or rash.  Neurological:     Mental Status: She is alert and oriented to person, place, and time. Mental status is at baseline.     Cranial Nerves: No cranial nerve deficit.     Coordination: Coordination normal.  Psychiatric:        Mood and Affect: Mood normal.     RADIOGRAPHIC STUDIES: I have personally reviewed the radiological images as listed and agreed with the findings in the report.  CMP Latest Ref Rng & Units 02/22/2020  Glucose 70 - 99 mg/dL 91  BUN 8 - 23 mg/dL 18  Creatinine 7.16 - 9.67 mg/dL 8.93  Sodium 810 - 175 mmol/L 139  Potassium 3.5 - 5.1 mmol/L 3.8  Chloride 98 - 111 mmol/L 101  CO2 22 - 32 mmol/L 30  Calcium 8.9 - 10.3 mg/dL 9.5  Total Protein 6.5 - 8.1 g/dL 7.5  Total Bilirubin 0.3 - 1.2 mg/dL 0.6   Alkaline Phos 38 - 126 U/L 62  AST 15 - 41 U/L 24  ALT 0 - 44 U/L 34   CBC Latest Ref Rng & Units 02/22/2020  WBC 4.0 - 10.5 K/uL 3.5(L)  Hemoglobin 12.0 - 15.0 g/dL 10.2  Hematocrit 58.5 - 46.0 % 37.0  Platelets 150 - 400 K/uL 268    LABORATORY DATA:  I have reviewed the data as listed Lab Results  Component Value Date   WBC 3.5 (L) 02/22/2020   HGB 12.6 02/22/2020   HCT 37.0 02/22/2020   MCV 93.2 02/22/2020   PLT 268 02/22/2020   Recent Labs    07/15/19 1121 10/26/19 1534 02/22/20 1111  NA 142 137 139  K 4.0 3.8 3.8  CL 105 101 101  CO2 25 28 30   GLUCOSE 91 94 91  BUN 22 22 18   CREATININE 0.76 0.83 0.63  CALCIUM 9.7 9.1 9.5  GFRNONAA 85 >60 >60  GFRAA 98  --   --   PROT 7.2 7.5 7.5  ALBUMIN 4.2 4.2 4.0  AST 21 23 24   ALT 27 26 34  ALKPHOS 77 60 62  BILITOT 0.5 0.7 0.6   Iron/TIBC/Ferritin/ %Sat No results found for: IRON, TIBC, FERRITIN, IRONPCTSAT   RADIOGRAPHIC STUDIES: I have personally reviewed the radiological images as listed and agreed with the findings in the report. No results found.    ASSESSMENT & PLAN:  1. Leukopenia, unspecified type    Leukopenia, predominantly neutropenia. Labs are reviewed and discussed with patient. Stable leukopenia, WBC 3.5.  ANC 1.  Relatively stable. 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  Vitamin B12 level is low normal end.  I recommend patient to continue take B12 supplementation empirically.  A prescription was sent to pharmacy.  Patient prefers no additional appointment be scheduled at this point as our facility is out of network for her insurance now.  All questions were answered. The patient knows to call the clinic with any problems questions or concerns.  Cc , NP  Return of visit: 3 months      , MD, PhD Hematology Oncology [facility] at [facility] Pager- [facility] 02/23/2020

## 2020-05-19 ENCOUNTER — Other Ambulatory Visit: Payer: Self-pay | Admitting: Nurse Practitioner

## 2020-05-19 DIAGNOSIS — I10 Essential (primary) hypertension: Secondary | ICD-10-CM

## 2020-05-29 ENCOUNTER — Other Ambulatory Visit: Payer: Self-pay | Admitting: Internal Medicine

## 2020-05-29 DIAGNOSIS — I10 Essential (primary) hypertension: Secondary | ICD-10-CM

## 2020-07-22 ENCOUNTER — Other Ambulatory Visit: Payer: Self-pay | Admitting: Internal Medicine

## 2020-07-22 DIAGNOSIS — I10 Essential (primary) hypertension: Secondary | ICD-10-CM

## 2020-07-24 ENCOUNTER — Other Ambulatory Visit: Payer: Self-pay

## 2020-07-24 NOTE — Telephone Encounter (Signed)
Pt need appt  For next refill

## 2020-07-31 ENCOUNTER — Other Ambulatory Visit: Payer: Self-pay | Admitting: Internal Medicine

## 2020-07-31 ENCOUNTER — Encounter: Payer: Self-pay | Admitting: Physician Assistant

## 2020-07-31 ENCOUNTER — Other Ambulatory Visit: Payer: Self-pay

## 2020-07-31 ENCOUNTER — Encounter (INDEPENDENT_AMBULATORY_CARE_PROVIDER_SITE_OTHER): Payer: Self-pay

## 2020-07-31 ENCOUNTER — Ambulatory Visit (INDEPENDENT_AMBULATORY_CARE_PROVIDER_SITE_OTHER): Payer: BLUE CROSS/BLUE SHIELD | Admitting: Physician Assistant

## 2020-07-31 ENCOUNTER — Other Ambulatory Visit: Payer: Self-pay | Admitting: Nurse Practitioner

## 2020-07-31 DIAGNOSIS — Z0001 Encounter for general adult medical examination with abnormal findings: Secondary | ICD-10-CM

## 2020-07-31 DIAGNOSIS — I10 Essential (primary) hypertension: Secondary | ICD-10-CM

## 2020-07-31 DIAGNOSIS — Z1231 Encounter for screening mammogram for malignant neoplasm of breast: Secondary | ICD-10-CM

## 2020-07-31 DIAGNOSIS — E2839 Other primary ovarian failure: Secondary | ICD-10-CM

## 2020-07-31 DIAGNOSIS — R300 Dysuria: Secondary | ICD-10-CM

## 2020-07-31 DIAGNOSIS — Z23 Encounter for immunization: Secondary | ICD-10-CM

## 2020-07-31 DIAGNOSIS — E559 Vitamin D deficiency, unspecified: Secondary | ICD-10-CM

## 2020-07-31 MED ORDER — ZOSTER VAC RECOMB ADJUVANTED 50 MCG/0.5ML IM SUSR: 0.5000 mL | Freq: Once | INTRAMUSCULAR | 0 refills | Status: AC

## 2020-07-31 NOTE — Progress Notes (Signed)
[facility] [address] [place], [place] [address]  Internal MEDICINE  Office Visit Note  Patient Name: Donna Donaldson  [id number]  [id number]  Date of Service: 08/06/2020  Chief Complaint  Patient presents with   Annual Exam   Hypertension   Quality Metric Gaps    Mammogram      HPI Pt is here for routine health maintenance examination and has no complaints today -Does not check BP at home, but is stable in office -Has to do labs via [facility]-- [employer] cover labs otherwise, so lab order given and patient will obtain results to be brought in to office for review -Due for colonoscopy next year -She is due for mammogram and bone density screenings, as well as shingles vaccine which she will have done at pharmacy  Current Medication: Outpatient Encounter Medications as of 07/31/2020  Medication Sig   hydrochlorothiazide (HYDRODIURIL) 25 MG tablet TAKE 1 TABLET(25 MG) BY MOUTH DAILY   lisinopril (ZESTRIL) 20 MG tablet TAKE 1 TABLET BY MOUTH DAILY   vitamin B-12 (CYANOCOBALAMIN) 1000 MCG tablet Take 1 tablet (1,000 mcg total) by mouth daily.   [DISCONTINUED] Zoster Vaccine Adjuvanted [facility]) injection Inject 0.5 mLs into the muscle once.   [EXPIRED] Zoster Vaccine Adjuvanted [facility]) injection Inject 0.5 mLs into the muscle once for 1 dose.   [DISCONTINUED] ciprofloxacin (CIPRO) 500 MG tablet Take 1 tablet (500 mg total) by mouth 2 (two) times daily. (Patient not taking: Reported on 02/23/2020)   [DISCONTINUED] Vitamin D, Ergocalciferol, (DRISDOL) 1.25 MG (50000 UNIT) CAPS capsule Take 1 capsule (50,000 Units total) by mouth every 7 (seven) days. (Patient not taking: Reported on 07/31/2020)   No facility-administered encounter medications on file as of 07/31/2020.    Surgical History: Past Surgical History:  Procedure Laterality Date   ABDOMINAL HYSTERECTOMY      Medical History: Past Medical History:  Diagnosis Date   Hypertension     Family  History: Family History  Problem Relation Age of Onset   Stroke Mother    Hypertension Mother    Diabetes Mother    Diabetes Maternal Aunt    Lung cancer Maternal Aunt    Lung disease Maternal Aunt    Bone cancer Maternal Grandfather    Breast cancer Neg Hx       Review of Systems  Constitutional:  Negative for chills, fatigue and unexpected weight change.  HENT:  Negative for congestion, postnasal drip, rhinorrhea, sneezing and sore throat.   Eyes:  Negative for redness.  Respiratory:  Negative for cough, chest tightness and shortness of breath.   Cardiovascular:  Negative for chest pain and palpitations.  Gastrointestinal:  Negative for abdominal pain, constipation, diarrhea, nausea and vomiting.  Genitourinary:  Negative for dysuria and frequency.  Musculoskeletal:  Negative for arthralgias, back pain, joint swelling and neck pain.  Skin:  Negative for rash.  Neurological: Negative.  Negative for tremors and numbness.  Hematological:  Negative for adenopathy. Does not bruise/bleed easily.  Psychiatric/Behavioral:  Negative for behavioral problems (Depression), sleep disturbance and suicidal ideas. The patient is not nervous/anxious.     Vital Signs: BP 130/82   Pulse 72   Temp 97.8 F (36.6 C)   Resp 16   Ht 5\' 8"  (1.727 m)   Wt 169 lb (76.7 kg)   SpO2 98%   BMI 25.70 kg/m    Physical Exam Vitals and nursing note reviewed.  Constitutional:      General: She is not in acute distress.  Appearance: She is well-developed and normal weight. She is not diaphoretic.  HENT:     Head: Normocephalic and atraumatic.     Right Ear: External ear normal.     Left Ear: External ear normal.     Nose: Nose normal.     Mouth/Throat:     Pharynx: No oropharyngeal exudate.  Eyes:     General: No scleral icterus.       Right eye: No discharge.        Left eye: No discharge.     Conjunctiva/sclera: Conjunctivae normal.     Pupils: Pupils are equal, round, and reactive to  light.  Neck:     Thyroid: No thyromegaly.     Vascular: No JVD.     Trachea: No tracheal deviation.  Cardiovascular:     Rate and Rhythm: Normal rate and regular rhythm.     Heart sounds: Normal heart sounds. No murmur heard.   No friction rub. No gallop.  Pulmonary:     Effort: Pulmonary effort is normal. No respiratory distress.     Breath sounds: Normal breath sounds. No stridor. No wheezing or rales.  Chest:     Chest wall: No tenderness.  Breasts:    Right: Normal.     Left: Normal.  Abdominal:     General: Bowel sounds are normal. There is no distension.     Palpations: Abdomen is soft. There is no mass.     Tenderness: There is no abdominal tenderness. There is no guarding or rebound.  Musculoskeletal:        General: No tenderness or deformity. Normal range of motion.     Cervical back: Normal range of motion and neck supple.  Lymphadenopathy:     Cervical: No cervical adenopathy.  Skin:    General: Skin is warm and dry.     Coloration: Skin is not pale.     Findings: No erythema or rash.  Neurological:     Mental Status: She is alert.     Cranial Nerves: No cranial nerve deficit.     Motor: No abnormal muscle tone.     Coordination: Coordination normal.     Deep Tendon Reflexes: Reflexes are normal and symmetric.  Psychiatric:        Behavior: Behavior normal.        Thought Content: Thought content normal.        Judgment: Judgment normal.     LABS: Recent Results (from the past 2160 hour(s))  UA/M w/rflx Culture, Routine     Status: Abnormal   Collection Time: 07/31/20 11:30 AM   Specimen: Urine   Urine  Result Value Ref Range   Specific Gravity, UA 1.027 1.005 - 1.030   pH, UA 5.5 5.0 - 7.5   Color, UA Yellow Yellow   Appearance Ur Turbid (A) Clear   Leukocytes,UA Negative Negative   Protein,UA Trace Negative/Trace   Glucose, UA Negative Negative   Ketones, UA Trace (A) Negative   RBC, UA Negative Negative   Bilirubin, UA Negative Negative    Urobilinogen, Ur 0.2 0.2 - 1.0 mg/dL   Nitrite, UA Negative Negative   Microscopic Examination Comment     Comment: Microscopic follows if indicated.   Microscopic Examination See below:     Comment: Microscopic was indicated and was performed.   Urinalysis Reflex Comment     Comment: This specimen will not reflex to a Urine Culture.  Microscopic Examination     Status: None  Collection Time: 07/31/20 11:30 AM   Urine  Result Value Ref Range   WBC, UA None seen 0 - 5 /hpf   RBC 0-2 0 - 2 /hpf   Epithelial Cells (non renal) None seen 0 - 10 /hpf   Casts None seen None seen /lpf   Bacteria, UA None seen None seen/Few        Assessment/Plan: 1. Encounter for general adult medical examination with abnormal findings CPE performed, lab orders given to be done at [facility] lab and pt will bring copy of results to office  2. Essential hypertension Stable, continue lisinopril and hctz  3. Need for shingles vaccine - Zoster Vaccine Adjuvanted [facility]) injection; Inject 0.5 mLs into the muscle once for 1 dose.  Dispense: 0.5 mL; Refill: 0  4. Encounter for screening mammogram for breast cancer - MM 3D SCREEN BREAST BILATERAL; Future  5. Primary ovarian failure - DG Bone Density; Future  6. Dysuria - UA/M w/rflx Culture, Routine   General Counseling: Clemence verbalizes understanding of the findings of todays visit and agrees with plan of treatment. I have discussed any further diagnostic evaluation that may be needed or ordered today. We also reviewed her medications today. she has been encouraged to call the office with any questions or concerns that should arise related to todays visit.    Counseling:    Orders Placed This Encounter  Procedures   Microscopic Examination   DG Bone Density   MM 3D SCREEN BREAST BILATERAL   UA/M w/rflx Culture, Routine    Meds ordered this encounter  Medications   Zoster Vaccine Adjuvanted [facility]) injection    Sig: Inject 0.5 mLs into  the muscle once for 1 dose.    Dispense:  0.5 mL    Refill:  0    This patient was seen by Lynn Ito, PA-C in collaboration with Dr. Beverely Risen as a part of collaborative care agreement.  Total time spent:35 Minutes  Time spent includes review of chart, medications, test results, and follow up plan with the patient.     Lyndon Code, MD  Internal Medicine

## 2020-08-01 LAB — UA/M W/RFLX CULTURE, ROUTINE
Bilirubin, UA: NEGATIVE
Glucose, UA: NEGATIVE
Leukocytes,UA: NEGATIVE
Nitrite, UA: NEGATIVE
RBC, UA: NEGATIVE
Specific Gravity, UA: 1.027 (ref 1.005–1.030)
Urobilinogen, Ur: 0.2 mg/dL (ref 0.2–1.0)
pH, UA: 5.5 (ref 5.0–7.5)

## 2020-08-01 LAB — MICROSCOPIC EXAMINATION
Bacteria, UA: NONE SEEN
Casts: NONE SEEN /lpf
Epithelial Cells (non renal): NONE SEEN /hpf (ref 0–10)
WBC, UA: NONE SEEN /hpf (ref 0–5)

## 2020-08-03 ENCOUNTER — Encounter: Payer: BLUE CROSS/BLUE SHIELD | Admitting: Nurse Practitioner

## 2020-08-08 ENCOUNTER — Encounter: Payer: BLUE CROSS/BLUE SHIELD | Admitting: Nurse Practitioner

## 2020-08-09 ENCOUNTER — Other Ambulatory Visit: Payer: Self-pay

## 2020-08-21 ENCOUNTER — Other Ambulatory Visit: Payer: Self-pay | Admitting: Internal Medicine

## 2020-08-21 DIAGNOSIS — I10 Essential (primary) hypertension: Secondary | ICD-10-CM

## 2020-08-22 ENCOUNTER — Other Ambulatory Visit: Payer: Self-pay | Admitting: Internal Medicine

## 2020-08-22 DIAGNOSIS — I10 Essential (primary) hypertension: Secondary | ICD-10-CM

## 2020-08-23 ENCOUNTER — Ambulatory Visit
Admission: RE | Admit: 2020-08-23 | Discharge: 2020-08-23 | Disposition: A | Discharge status: Home / Self Care | Payer: BLUE CROSS/BLUE SHIELD | Source: Ambulatory Visit | Attending: Physician Assistant | Admitting: Physician Assistant

## 2020-08-23 ENCOUNTER — Other Ambulatory Visit: Payer: Self-pay

## 2020-08-23 DIAGNOSIS — I10 Essential (primary) hypertension: Secondary | ICD-10-CM

## 2020-08-23 DIAGNOSIS — Z23 Encounter for immunization: Secondary | ICD-10-CM

## 2020-08-23 DIAGNOSIS — E2839 Other primary ovarian failure: Secondary | ICD-10-CM

## 2020-08-23 DIAGNOSIS — Z1231 Encounter for screening mammogram for malignant neoplasm of breast: Secondary | ICD-10-CM | POA: Diagnosis present

## 2020-08-23 DIAGNOSIS — Z0001 Encounter for general adult medical examination with abnormal findings: Secondary | ICD-10-CM

## 2020-08-23 DIAGNOSIS — R300 Dysuria: Secondary | ICD-10-CM

## 2021-01-29 ENCOUNTER — Ambulatory Visit: Payer: BLUE CROSS/BLUE SHIELD | Admitting: Physician Assistant

## 2021-03-06 ENCOUNTER — Other Ambulatory Visit: Payer: Self-pay | Admitting: Internal Medicine

## 2021-03-06 ENCOUNTER — Telehealth: Payer: Self-pay

## 2021-03-06 DIAGNOSIS — I10 Essential (primary) hypertension: Secondary | ICD-10-CM

## 2021-03-06 NOTE — Telephone Encounter (Signed)
Canceled prescriptions

## 2021-03-06 NOTE — Telephone Encounter (Signed)
Called [facility] and canceled HCTZ and Lisinopril rx's due to pt has transferred care as of 01/25/2021 per courtney

## 2021-07-20 ENCOUNTER — Encounter: Payer: BLUE CROSS/BLUE SHIELD | Admitting: Physician Assistant

## 2021-08-02 ENCOUNTER — Encounter: Payer: BLUE CROSS/BLUE SHIELD | Admitting: Physician Assistant
